# Patient Record
Sex: Female | Born: 1935 | Race: Black or African American | Hispanic: No | Marital: Single | Smoking: Never smoker
Health system: Southern US, Community
[De-identification: ages and names within clinical notes are randomized; demographics above are authoritative.]

## PROBLEM LIST (undated history)

## (undated) DIAGNOSIS — H269 Unspecified cataract: Secondary | ICD-10-CM

## (undated) DIAGNOSIS — Z8679 Personal history of other diseases of the circulatory system: Secondary | ICD-10-CM

## (undated) DIAGNOSIS — N289 Disorder of kidney and ureter, unspecified: Secondary | ICD-10-CM

## (undated) DIAGNOSIS — G8929 Other chronic pain: Secondary | ICD-10-CM

## (undated) DIAGNOSIS — I10 Essential (primary) hypertension: Secondary | ICD-10-CM

## (undated) DIAGNOSIS — G3184 Mild cognitive impairment, so stated: Secondary | ICD-10-CM

## (undated) DIAGNOSIS — M25569 Pain in unspecified knee: Secondary | ICD-10-CM

## (undated) DIAGNOSIS — R7303 Prediabetes: Secondary | ICD-10-CM

## (undated) HISTORY — DX: Disorder of kidney and ureter, unspecified: N28.9

## (undated) HISTORY — DX: Personal history of other diseases of the circulatory system: Z86.79

## (undated) HISTORY — DX: Essential (primary) hypertension: I10

## (undated) HISTORY — DX: Mild cognitive impairment of uncertain or unknown etiology: G31.84

## (undated) HISTORY — DX: Unspecified cataract: H26.9

## (undated) HISTORY — PX: ABDOMINAL HYSTERECTOMY: SHX81

## (undated) HISTORY — DX: Prediabetes: R73.03

## (undated) HISTORY — PX: CATARACT EXTRACTION: SUR2

---

## 2014-04-30 DIAGNOSIS — L853 Xerosis cutis: Secondary | ICD-10-CM | POA: Diagnosis not present

## 2014-05-07 DIAGNOSIS — L2089 Other atopic dermatitis: Secondary | ICD-10-CM | POA: Diagnosis not present

## 2014-07-10 DIAGNOSIS — H40013 Open angle with borderline findings, low risk, bilateral: Secondary | ICD-10-CM | POA: Diagnosis not present

## 2014-10-15 DIAGNOSIS — S76811A Strain of other specified muscles, fascia and tendons at thigh level, right thigh, initial encounter: Secondary | ICD-10-CM | POA: Diagnosis not present

## 2014-10-15 DIAGNOSIS — M25551 Pain in right hip: Secondary | ICD-10-CM | POA: Diagnosis not present

## 2015-01-05 DIAGNOSIS — H538 Other visual disturbances: Secondary | ICD-10-CM | POA: Diagnosis not present

## 2015-01-05 DIAGNOSIS — J42 Unspecified chronic bronchitis: Secondary | ICD-10-CM | POA: Diagnosis not present

## 2015-01-05 DIAGNOSIS — H269 Unspecified cataract: Secondary | ICD-10-CM | POA: Diagnosis not present

## 2015-01-10 DIAGNOSIS — H53433 Sector or arcuate defects, bilateral: Secondary | ICD-10-CM | POA: Diagnosis not present

## 2015-01-10 DIAGNOSIS — H2512 Age-related nuclear cataract, left eye: Secondary | ICD-10-CM | POA: Diagnosis not present

## 2015-01-10 DIAGNOSIS — H16223 Keratoconjunctivitis sicca, not specified as Sjogren's, bilateral: Secondary | ICD-10-CM | POA: Diagnosis not present

## 2015-01-10 DIAGNOSIS — H40013 Open angle with borderline findings, low risk, bilateral: Secondary | ICD-10-CM | POA: Diagnosis not present

## 2015-02-04 DIAGNOSIS — H2512 Age-related nuclear cataract, left eye: Secondary | ICD-10-CM | POA: Diagnosis not present

## 2015-02-04 DIAGNOSIS — H269 Unspecified cataract: Secondary | ICD-10-CM | POA: Diagnosis not present

## 2015-05-05 DIAGNOSIS — H40013 Open angle with borderline findings, low risk, bilateral: Secondary | ICD-10-CM | POA: Diagnosis not present

## 2015-09-10 DIAGNOSIS — H40013 Open angle with borderline findings, low risk, bilateral: Secondary | ICD-10-CM | POA: Diagnosis not present

## 2015-11-24 ENCOUNTER — Emergency Department (HOSPITAL_COMMUNITY): Payer: Medicare Other

## 2015-11-24 ENCOUNTER — Emergency Department (HOSPITAL_COMMUNITY)
Admission: EM | Admit: 2015-11-24 | Discharge: 2015-11-24 | Disposition: A | Discharge status: Home / Self Care | Payer: Medicare Other | Attending: Emergency Medicine | Admitting: Emergency Medicine

## 2015-11-24 ENCOUNTER — Encounter (HOSPITAL_COMMUNITY): Payer: Self-pay | Admitting: Emergency Medicine

## 2015-11-24 DIAGNOSIS — K5900 Constipation, unspecified: Secondary | ICD-10-CM | POA: Diagnosis not present

## 2015-11-24 DIAGNOSIS — M79604 Pain in right leg: Secondary | ICD-10-CM | POA: Insufficient documentation

## 2015-11-24 DIAGNOSIS — M79661 Pain in right lower leg: Secondary | ICD-10-CM | POA: Diagnosis not present

## 2015-11-24 LAB — COMPREHENSIVE METABOLIC PANEL
ALBUMIN: 3.9 g/dL (ref 3.5–5.0)
ALK PHOS: 67 U/L (ref 38–126)
ALT: 17 U/L (ref 14–54)
AST: 18 U/L (ref 15–41)
Anion gap: 6 (ref 5–15)
BILIRUBIN TOTAL: 0.3 mg/dL (ref 0.3–1.2)
BUN: 17 mg/dL (ref 6–20)
CALCIUM: 9.1 mg/dL (ref 8.9–10.3)
CO2: 27 mmol/L (ref 22–32)
Chloride: 106 mmol/L (ref 101–111)
Creatinine, Ser: 1.09 mg/dL — ABNORMAL HIGH (ref 0.44–1.00)
GFR calc Af Amer: 51 mL/min — ABNORMAL LOW (ref >60)
GFR calc non Af Amer: 44 mL/min — ABNORMAL LOW (ref >60)
GLUCOSE: 84 mg/dL (ref 65–99)
Potassium: 4.1 mmol/L (ref 3.5–5.1)
SODIUM: 139 mmol/L (ref 135–145)
TOTAL PROTEIN: 7 g/dL (ref 6.5–8.1)

## 2015-11-24 LAB — CBC WITH DIFFERENTIAL/PLATELET
BASOS ABS: 0 10*3/uL (ref 0.0–0.1)
BASOS PCT: 0 %
EOS ABS: 0.1 10*3/uL (ref 0.0–0.7)
Eosinophils Relative: 1 %
HEMATOCRIT: 43.7 % (ref 36.0–46.0)
HEMOGLOBIN: 14.1 g/dL (ref 12.0–15.0)
Lymphocytes Relative: 34 %
Lymphs Abs: 2.2 10*3/uL (ref 0.7–4.0)
MCH: 29.3 pg (ref 26.0–34.0)
MCHC: 32.3 g/dL (ref 30.0–36.0)
MCV: 90.9 fL (ref 78.0–100.0)
MONOS PCT: 11 %
Monocytes Absolute: 0.7 10*3/uL (ref 0.1–1.0)
NEUTROS ABS: 3.4 10*3/uL (ref 1.7–7.7)
NEUTROS PCT: 54 %
Platelets: 210 10*3/uL (ref 150–400)
RBC: 4.81 MIL/uL (ref 3.87–5.11)
RDW: 12.9 % (ref 11.5–15.5)
WBC: 6.3 10*3/uL (ref 4.0–10.5)

## 2015-11-24 NOTE — Discharge Instructions (Signed)
Your ultrasound was normal !  You can follow up with your doctor as needed Motrin / tylenol for pain

## 2015-11-24 NOTE — ED Notes (Signed)
MD at bedside to review discharge.Patient given discharge instruction, verbalized understand. Patient ambulatory out of the department.

## 2015-11-24 NOTE — ED Provider Notes (Signed)
[facility] Provider Note   CSN: [id number] Arrival date & time: 11/24/15  1129  First Provider Contact:  First MD Initiated Contact with Patient 11/24/15 1324     By signing my name below, I, Emmanuella Mensah, attest that this documentation has been prepared under the direction and in the presence of Eber Hong, MD. Electronically Signed: Angelene Giovanni, ED Scribe. 11/24/15. 1:37 PM.    History   Chief Complaint Chief Complaint  Patient presents with  . Leg Pain    HPI Comments: Lisa Harper is a 82 y.o. female who presents to the Emergency Department complaining of ongoing moderate right leg pain (mostly knee) onset 2-3 months ago. She states that she notices that her right leg is more achy and stiff when there is overcast, especially in the am when she wakes up. She also c/o straining while having small round dark stool onset several months ago. No alleviating factors noted. She reports that she has been exercising with mild relief. She denies any recent falls, injuries, or trauma. She denies a hx of cancer. Pt denies that she is current smoker. She also denies any leg swelling, chest pain, SOB.  She denies any risk factors for a DVT.   The history is provided by the patient. No language interpreter was used.    History reviewed. No pertinent past medical history.  There are no active problems to display for this patient.   Past Surgical History:  Procedure Laterality Date  . CATARACT EXTRACTION      OB History    Gravida Para Term Preterm AB Living             0   SAB TAB Ectopic Multiple Live Births                   Home Medications    Prior to Admission medications   Not on File    Family History History reviewed. No pertinent family history.  Social History Social History  Substance Use Topics  . Smoking status: Never Smoker  . Smokeless tobacco: Never Used  . Alcohol use No     Allergies   Review of patient's allergies indicates no  known allergies.   Review of Systems Review of Systems  Respiratory: Negative for shortness of breath.   Cardiovascular: Negative for chest pain and leg swelling.  Gastrointestinal: Positive for constipation.  Musculoskeletal: Positive for arthralgias.  All other systems reviewed and are negative.    Physical Exam Updated Vital Signs BP 178/57 (BP Location: Left Arm)   Pulse (!) 48   Temp 98.1 F (36.7 C) (Oral)   Resp 17   Ht 5\' 4"  (1.626 m)   Wt 128 lb (58.1 kg)   SpO2 100%   BMI 21.97 kg/m   Physical Exam  Constitutional: She appears well-developed and well-nourished. No distress.  HENT:  Head: Normocephalic and atraumatic.  Mouth/Throat: Oropharynx is clear and moist. No oropharyngeal exudate.  Eyes: Conjunctivae and EOM are normal. Pupils are equal, round, and reactive to light. Right eye exhibits no discharge. Left eye exhibits no discharge. No scleral icterus.  Neck: Normal range of motion. Neck supple. No JVD present. No thyromegaly present.  Cardiovascular: Normal rate, regular rhythm, normal heart sounds and intact distal pulses.  Exam reveals no gallop and no friction rub.   No murmur heard. Pulmonary/Chest: Effort normal and breath sounds normal. No respiratory distress. She has no wheezes. She has no rales.  Abdominal: Soft. Bowel sounds are normal.  She exhibits no distension and no mass. There is no tenderness.  Musculoskeletal: Normal range of motion. She exhibits no edema or tenderness.  Supple joints and soft compartments, no edema or swelling or asymetry  Lymphadenopathy:    She has no cervical adenopathy.  Neurological: She is alert. Coordination normal.  Skin: Skin is warm and dry. No rash noted. No erythema.  Psychiatric: She has a normal mood and affect. Her behavior is normal.  Nursing note and vitals reviewed.    ED Treatments / Results  DIAGNOSTIC STUDIES: Oxygen Saturation is 99% on RA, normal by my interpretation.    COORDINATION OF  CARE: 1:35 PM- Pt advised of plan for treatment and pt agrees. Pt will receive lab work for further evaluation.    Labs (all labs ordered are listed, but only abnormal results are displayed) Labs Reviewed  COMPREHENSIVE METABOLIC PANEL - Abnormal; Notable for the following:       Result Value   Creatinine, Ser 1.09 (*)    GFR calc non Af Amer 44 (*)    GFR calc Af Amer 51 (*)    All other components within normal limits  CBC WITH DIFFERENTIAL/PLATELET    EKG  EKG Interpretation None       Radiology US Venous Img Lower Unilateral Right  Result Date: 11/24/2015 CLINICAL DATA:  Right lower extremity pain EXAM: RIGHT LOWER EXTREMITY VENOUS DOPPLER ULTRASOUND TECHNIQUE: Gray-scale sonography with graded compression, as well as color Doppler and duplex ultrasound were performed to evaluate the lower extremity deep venous systems from the level of the common femoral vein and including the common femoral, femoral, profunda femoral, popliteal and calf veins including the posterior tibial, peroneal and gastrocnemius veins when visible. The superficial great saphenous vein was also interrogated. Spectral Doppler was utilized to evaluate flow at rest and with distal augmentation maneuvers in the common femoral, femoral and popliteal veins. COMPARISON:  None. FINDINGS: Contralateral Common Femoral Vein: Respiratory phasicity is normal and symmetric with the symptomatic side. No evidence of thrombus. Normal compressibility. Common Femoral Vein: No evidence of thrombus. Normal compressibility, respiratory phasicity and response to augmentation. Saphenofemoral Junction: No evidence of thrombus. Normal compressibility and flow on color Doppler imaging. Profunda Femoral Vein: No evidence of thrombus. Normal compressibility and flow on color Doppler imaging. Femoral Vein: No evidence of thrombus. Normal compressibility, respiratory phasicity and response to augmentation. Popliteal Vein: No evidence of  thrombus. Normal compressibility, respiratory phasicity and response to augmentation. Calf Veins: No evidence of thrombus. Normal compressibility and flow on color Doppler imaging. Superficial Great Saphenous Vein: No evidence of thrombus. Normal compressibility and flow on color Doppler imaging. Venous Reflux:  None. Other Findings:  None. IMPRESSION: No evidence of deep venous thrombosis. Electronically Signed   By: Gerome Sam III M.D   On: 11/24/2015 15:25    Procedures Procedures (including critical care time)  Medications Ordered in ED Medications - No data to display   Initial Impression / Assessment and Plan / ED Course  Eber Hong, MD has reviewed the triage vital signs and the nursing notes.  Pertinent labs & imaging results that were available during my care of the patient were reviewed by me and considered in my medical decision making (see chart for details).  Clinical Course    [place] neg, labs neg, pt stable for d/c.  Final Clinical Impressions(s) / ED Diagnoses   Final diagnoses:  Right leg pain   I personally performed the services described in this documentation, which was scribed  in my presence. The recorded information has been reviewed and is accurate.       New Prescriptions New Prescriptions   No medications on file     Eber Hong, MD 11/24/15 1656

## 2015-11-24 NOTE — ED Triage Notes (Signed)
PT c/o bilateral leg pain at times and states "my legs hurt when there is a heavy overcast." PT also states generalized weakness x1 month. PT denies any falls or injury.

## 2016-01-06 DIAGNOSIS — I10 Essential (primary) hypertension: Secondary | ICD-10-CM | POA: Diagnosis not present

## 2016-01-06 DIAGNOSIS — M21619 Bunion of unspecified foot: Secondary | ICD-10-CM | POA: Diagnosis not present

## 2016-01-06 DIAGNOSIS — R011 Cardiac murmur, unspecified: Secondary | ICD-10-CM | POA: Diagnosis not present

## 2016-01-06 DIAGNOSIS — M2040 Other hammer toe(s) (acquired), unspecified foot: Secondary | ICD-10-CM | POA: Diagnosis not present

## 2016-01-06 DIAGNOSIS — M25569 Pain in unspecified knee: Secondary | ICD-10-CM | POA: Diagnosis not present

## 2016-01-06 DIAGNOSIS — G3184 Mild cognitive impairment, so stated: Secondary | ICD-10-CM | POA: Diagnosis not present

## 2016-01-27 DIAGNOSIS — Z961 Presence of intraocular lens: Secondary | ICD-10-CM | POA: Diagnosis not present

## 2016-02-09 DIAGNOSIS — I10 Essential (primary) hypertension: Secondary | ICD-10-CM | POA: Diagnosis not present

## 2016-02-09 DIAGNOSIS — Z0000 Encounter for general adult medical examination without abnormal findings: Secondary | ICD-10-CM | POA: Diagnosis not present

## 2016-02-20 ENCOUNTER — Telehealth: Payer: Self-pay

## 2016-02-20 NOTE — Telephone Encounter (Signed)
Called pt to set up new pt appt with dr [name], pt states she will call back when she is able to come.

## 2016-02-24 ENCOUNTER — Ambulatory Visit (INDEPENDENT_AMBULATORY_CARE_PROVIDER_SITE_OTHER): Payer: Medicare Other | Admitting: Family Medicine

## 2016-02-24 ENCOUNTER — Encounter: Payer: Self-pay | Admitting: Family Medicine

## 2016-02-24 VITALS — BP 104/60 | HR 56 | Temp 97.7°F | Resp 16 | Ht 65.0 in | Wt 122.0 lb

## 2016-02-24 DIAGNOSIS — Z2821 Immunization not carried out because of patient refusal: Secondary | ICD-10-CM | POA: Diagnosis not present

## 2016-02-24 DIAGNOSIS — Z961 Presence of intraocular lens: Secondary | ICD-10-CM | POA: Insufficient documentation

## 2016-02-24 DIAGNOSIS — N289 Disorder of kidney and ureter, unspecified: Secondary | ICD-10-CM

## 2016-02-24 DIAGNOSIS — Z7689 Persons encountering health services in other specified circumstances: Secondary | ICD-10-CM

## 2016-02-24 DIAGNOSIS — J302 Other seasonal allergic rhinitis: Secondary | ICD-10-CM | POA: Insufficient documentation

## 2016-02-24 HISTORY — DX: Disorder of kidney and ureter, unspecified: N28.9

## 2016-02-24 NOTE — Progress Notes (Signed)
Chief Complaint  Patient presents with  . Establish Care   New to establish Poor historian with evasive answers at times.  She says she has no medical problems but the record indicates hypertension with a past use of cozaar. Single, lives alone, no children.  Still drives and cares for herself. Here with a cousin She only complains of watery eyes and runny nose, this fall. Says she eats well and cares for herself, takes no medicine She does not want immunizations   Patient Active Problem List   Diagnosis Date Noted  . Immunization refused 02/24/2016  . Renal insufficiency 02/24/2016  . Pseudophakia of both eyes 02/24/2016  . Seasonal allergies 02/24/2016    Outpatient Encounter Prescriptions as of 02/24/2016  Medication Sig  . Sennosides (EX-LAX) 15 MG CHEW Chew by mouth.   No facility-administered encounter medications on file as of 02/24/2016.     Past Medical History:  Diagnosis Date  . Cataract   . Renal insufficiency 02/24/2016    Past Surgical History:  Procedure Laterality Date  . ABDOMINAL HYSTERECTOMY     for pain  . CATARACT EXTRACTION      Social History   Social History  . Marital status: Single    Spouse name: N/A  . Number of children: N/A  . Years of education: N/A   Occupational History  . Not on file.   Social History Main Topics  . Smoking status: Never Smoker  . Smokeless tobacco: Never Used  . Alcohol use No  . Drug use: No  . Sexual activity: Not Currently   Other Topics Concern  . Not on file   Social History Narrative   Single,  Lives alone.  Never married, no children    Family History  Problem Relation Age of Onset  . Suicidality Father     Review of Systems  Constitutional: Negative for chills, fever and weight loss.  HENT: Negative for congestion and hearing loss.        Clear runny nose  Eyes: Negative for blurred vision and pain.       "watery eyes"  Respiratory: Negative for cough and shortness of breath.     Cardiovascular: Negative for chest pain and leg swelling.  Gastrointestinal: Negative for abdominal pain, constipation, diarrhea and heartburn.  Genitourinary: Negative for dysuria and frequency.  Musculoskeletal: Negative for falls, joint pain and myalgias.  Neurological: Negative for dizziness, seizures and headaches.  Psychiatric/Behavioral: Negative for depression. The patient is not nervous/anxious and does not have insomnia.     BP 104/60 (BP Location: Right Arm, Patient Position: Sitting, Cuff Size: Normal)   Pulse (!) 56   Temp 97.7 F (36.5 C) (Oral)   Resp 16   Ht 5\' 5"  (1.651 m)   Wt 122 lb 0.6 oz (55.4 kg)   SpO2 99%   BMI 20.31 kg/m   Physical Exam  Constitutional: She is oriented to person, place, and time. She appears well-developed and well-nourished.  HENT:  Head: Normocephalic and atraumatic.  Right Ear: External ear normal.  Left Ear: External ear normal.  Mouth/Throat: Oropharynx is clear and moist.  Eyes: Conjunctivae are normal. Pupils are equal, round, and reactive to light.  Neck: Normal range of motion. Neck supple. No thyromegaly present.  Cardiovascular: Normal rate, regular rhythm and normal heart sounds.   Pulmonary/Chest: Effort normal and breath sounds normal. No respiratory distress.  Abdominal: Soft. Bowel sounds are normal.  Musculoskeletal: Normal range of motion. She exhibits no edema.  Lymphadenopathy:  She has no cervical adenopathy.  Neurological: She is alert and oriented to person, place, and time.  Gait normal  Skin: Skin is warm and dry.  Psychiatric: She has a normal mood and affect. Her behavior is normal. Thought content normal.  Articulate and smiling, evasive answers at times  Nursing note and vitals reviewed. ASSESSMENT/PLAN:   1. Encounter to establish care with new doctor   2. Immunization refused   3. Renal insufficiency From record  4. Pseudophakia of both eyes   5. Chronic seasonal allergic rhinitis,  unspecified trigger    Patient Instructions  We need to get records from Lisa Harper  See me once a year  Come sooner for any illness or medical problem  For the "allergy" eye symptoms, you may get a the pharmacy an allergy pill like loratadine 10 mg a day, or an allergy eye drop like Opcon-A    Lisa MooreYvonne Sue Arielis Leonhart, MD

## 2016-02-24 NOTE — Patient Instructions (Signed)
We need to get records from Dr Isabella StallingMc Ginnis  See me once a year  Come sooner for any illness or medical problem  For the "allergy" eye symptoms, you may get a the pharmacy an allergy pill like loratadine 10 mg a day, or an allergy eye drop like Opcon-A

## 2016-05-05 DIAGNOSIS — H04123 Dry eye syndrome of bilateral lacrimal glands: Secondary | ICD-10-CM | POA: Diagnosis not present

## 2016-05-20 ENCOUNTER — Ambulatory Visit: Payer: Medicare Other | Admitting: Family Medicine

## 2016-05-25 ENCOUNTER — Encounter: Payer: Self-pay | Admitting: Family Medicine

## 2016-06-01 ENCOUNTER — Ambulatory Visit (INDEPENDENT_AMBULATORY_CARE_PROVIDER_SITE_OTHER): Payer: Medicare Other | Admitting: Family Medicine

## 2016-06-01 ENCOUNTER — Encounter: Payer: Self-pay | Admitting: Family Medicine

## 2016-06-01 VITALS — BP 120/62 | HR 60 | Temp 97.0°F | Resp 18 | Ht 65.0 in | Wt 121.0 lb

## 2016-06-01 DIAGNOSIS — N289 Disorder of kidney and ureter, unspecified: Secondary | ICD-10-CM | POA: Diagnosis not present

## 2016-06-01 DIAGNOSIS — Z022 Encounter for examination for admission to residential institution: Secondary | ICD-10-CM | POA: Diagnosis not present

## 2016-06-01 NOTE — Progress Notes (Signed)
Chief Complaint  Patient presents with  . Follow-up    FL2   Here for assisted living placement Patient lives independently and drives She prefers help in the home Is looking at options  No recent falls No loss of weight No recent illness No prescription medicines    Patient Active Problem List   Diagnosis Date Noted  . Immunization refused 02/24/2016  . Renal insufficiency 02/24/2016  . Pseudophakia of both eyes 02/24/2016  . Seasonal allergies 02/24/2016    Outpatient Encounter Prescriptions as of 06/01/2016  Medication Sig  . Sennosides (EX-LAX) 15 MG CHEW Chew by mouth.   No facility-administered encounter medications on file as of 06/01/2016.     Past Medical History:  Diagnosis Date  . Cataract   . Essential hypertension    not currently on medicine  . History of cardiac murmur   . Mild cognitive impairment   . Pre-diabetes   . Renal insufficiency 02/24/2016    Past Surgical History:  Procedure Laterality Date  . ABDOMINAL HYSTERECTOMY     for pain  . CATARACT EXTRACTION      Social History   Social History  . Marital status: Single    Spouse name: N/A  . Number of children: N/A  . Years of education: N/A   Occupational History  . Not on file.   Social History Main Topics  . Smoking status: Never Smoker  . Smokeless tobacco: Never Used  . Alcohol use No  . Drug use: No  . Sexual activity: Not Currently   Other Topics Concern  . Not on file   Social History Narrative   Single,  Lives alone.  Never married, no children    Family History  Problem Relation Age of Onset  . Suicidality Father     Review of Systems  Constitutional: Negative for chills, fever and weight loss.  HENT: Negative for congestion and hearing loss.   Eyes: Negative for blurred vision and pain.  Respiratory: Negative for cough and shortness of breath.   Cardiovascular: Negative for chest pain and leg swelling.  Gastrointestinal: Positive for constipation.  Negative for abdominal pain, diarrhea and heartburn.       Occasional  Genitourinary: Negative for dysuria and frequency.  Musculoskeletal: Negative for falls, joint pain and myalgias.  Skin:       Dry skin.  Dry eyes  Neurological: Negative for dizziness, seizures and headaches.  Psychiatric/Behavioral: Negative for depression and memory loss. The patient is not nervous/anxious and does not have insomnia.    BP 120/62 (BP Location: Right Arm, Patient Position: Sitting, Cuff Size: Normal)   Pulse 60   Temp 97 F (36.1 C) (Temporal)   Resp 18   Ht 5\' 5"  (1.651 m)   Wt 121 lb (54.9 kg)   SpO2 100%   BMI 20.14 kg/m   Physical Exam  Constitutional: She is oriented to person, place, and time. She appears well-developed and well-nourished.  Good posture.  Looks well for 89  HENT:  Head: Normocephalic and atraumatic.  Right Ear: External ear normal.  Left Ear: External ear normal.  Mouth/Throat: Oropharynx is clear and moist.  Eyes: Conjunctivae are normal. Pupils are equal, round, and reactive to light.  Neck: Normal range of motion. Neck supple. No thyromegaly present.  Cardiovascular: Normal rate, regular rhythm and normal heart sounds.   Pulmonary/Chest: Effort normal and breath sounds normal. No respiratory distress.  Abdominal: Soft. Bowel sounds are normal.  No organomegaly or mass  Musculoskeletal:  Normal range of motion. She exhibits no edema.  Bunion R foot  Lymphadenopathy:    She has no cervical adenopathy.  Neurological: She is alert and oriented to person, place, and time.  Gait normal  Skin: Skin is warm and dry.  Psychiatric: She has a normal mood and affect. Thought content is paranoid. Cognition and memory are normal. She expresses impulsivity.  Evasive answers.  Normal mini cog with memory 2/3 recall and excellent clock drawing. Eccentric behavior.  Paranoid ideas at times ( calls police a lot)  Nursing note and vitals reviewed.  ASSESSMENT/PLAN:  1.  Encounter for examination for admission to assisted living facility See forms  2. Renal insufficiency History.  Reminded to drink water   Patient Instructions  I will submit your forms  Return as needed   Eustace Moore, MD

## 2016-06-01 NOTE — Patient Instructions (Signed)
I will submit your forms  Return as needed

## 2016-06-14 ENCOUNTER — Encounter: Payer: Self-pay | Admitting: Family Medicine

## 2016-06-14 ENCOUNTER — Ambulatory Visit (INDEPENDENT_AMBULATORY_CARE_PROVIDER_SITE_OTHER): Payer: Medicare Other | Admitting: Family Medicine

## 2016-06-14 VITALS — BP 116/68 | HR 64 | Temp 97.4°F | Resp 18 | Ht 65.0 in | Wt 121.1 lb

## 2016-06-14 DIAGNOSIS — K5909 Other constipation: Secondary | ICD-10-CM

## 2016-06-14 NOTE — Progress Notes (Signed)
Chief Complaint  Patient presents with  . Constipation    per pt   Patient is here for an acute visit. She states that she was awakened this morning with sharp lower abdominal pain. She had 2 or 3 waves of pain. When she got up she had a bowel movement. The pain then resolved. She states she has chronic constipation. She has hard stool. She has trouble moving her stool. She takes laxatives once or twice a week if she hasn't had a bowel movement for a couple of days. No blood in the bowels. No weight loss. No change in appetite.  Patient Active Problem List   Diagnosis Date Noted  . Immunization refused 02/24/2016  . Renal insufficiency 02/24/2016  . Pseudophakia of both eyes 02/24/2016  . Seasonal allergies 02/24/2016    Outpatient Encounter Prescriptions as of 06/14/2016  Medication Sig  . Sennosides (EX-LAX) 15 MG CHEW Chew by mouth.   No facility-administered encounter medications on file as of 06/14/2016.     No Known Allergies  Review of Systems  Constitutional: Negative for activity change, appetite change, fatigue and unexpected weight change.  HENT: Negative for congestion and postnasal drip.   Eyes: Negative for visual disturbance.  Respiratory: Negative for cough and shortness of breath.   Cardiovascular: Negative for chest pain and palpitations.  Gastrointestinal: Positive for constipation. Negative for anal bleeding, blood in stool, nausea and vomiting.  Genitourinary: Negative for difficulty urinating and frequency.  Musculoskeletal: Negative.   Psychiatric/Behavioral: Negative for sleep disturbance. The patient is not nervous/anxious.   All other systems reviewed and are negative.  BP 116/68 (BP Location: Left Arm, Patient Position: Sitting, Cuff Size: Normal)   Pulse 64   Temp 97.4 F (36.3 C) (Oral)   Resp 18   Ht 5\' 5"  (1.651 m)   Wt 121 lb 1.9 oz (54.9 kg)   SpO2 100%   BMI 20.16 kg/m   Physical Exam  Constitutional: She is oriented to person,  place, and time. She appears well-developed and well-nourished. No distress.  HENT:  Head: Normocephalic and atraumatic.  Mouth/Throat: Oropharynx is clear and moist.  Eyes: Conjunctivae are normal. Pupils are equal, round, and reactive to light.  Neck: Normal range of motion.  Cardiovascular: Normal rate, regular rhythm and normal heart sounds.   Pulmonary/Chest: Effort normal.  Abdominal: Soft. Bowel sounds are normal. She exhibits no distension. There is no tenderness.  Lymphadenopathy:    She has no cervical adenopathy.  Neurological: She is alert and oriented to person, place, and time.  Psychiatric: She has a normal mood and affect. Her behavior is normal.    ASSESSMENT/PLAN:  1. Chronic constipation    Patient Instructions  Drink more water Walk every day that you are able Eat more vegetables and fruits Add a fiber supplement like metamucil   Constipation, Adult Constipation is when a person has fewer bowel movements in a week than normal, has difficulty having a bowel movement, or has stools that are dry, hard, or larger than normal. Constipation may be caused by an underlying condition. It may become worse with age if a person takes certain medicines and does not take in enough fluids. Follow these instructions at home: Eating and drinking  Eat foods that have a lot of fiber, such as fresh fruits and vegetables, whole grains, and beans.  Limit foods that are high in fat, low in fiber, or overly processed, such as french fries, hamburgers, cookies, candies, and soda.  Drink enough  fluid to keep your urine clear or pale yellow. General instructions  Exercise regularly or as told by your health care provider.  Go to the restroom when you have the urge to go. Do not hold it in.  Take over-the-counter and prescription medicines only as told by your health care provider. These include any fiber supplements.  Practice pelvic floor retraining exercises, such as deep  breathing while relaxing the lower abdomen and pelvic floor relaxation during bowel movements.  Watch your condition for any changes.  Keep all follow-up visits as told by your health care provider. This is important. Contact a health care provider if:  You have pain that gets worse.  You have a fever.  You do not have a bowel movement after 4 days.  You vomit.  You are not hungry.  You lose weight.  You are bleeding from the anus.  You have thin, pencil-like stools. Get help right away if:  You have a fever and your symptoms suddenly get worse.  You leak stool or have blood in your stool.  Your abdomen is bloated.  You have severe pain in your abdomen.  You feel dizzy or you faint. This information is not intended to replace advice given to you by your health care provider. Make sure you discuss any questions you have with your health care provider. Document Released: 01/09/2004 Document Revised: 10/31/2015 Document Reviewed: 10/01/2015 Elsevier Interactive Patient Education  2017 [employer]    Eustace MooreYvonne Sue Weylyn Ricciuti, MD

## 2016-06-14 NOTE — Patient Instructions (Addendum)
Drink more water Walk every day that you are able Eat more vegetables and fruits Add a fiber supplement like metamucil   Constipation, Adult Constipation is when a person has fewer bowel movements in a week than normal, has difficulty having a bowel movement, or has stools that are dry, hard, or larger than normal. Constipation may be caused by an underlying condition. It may become worse with age if a person takes certain medicines and does not take in enough fluids. Follow these instructions at home: Eating and drinking  Eat foods that have a lot of fiber, such as fresh fruits and vegetables, whole grains, and beans.  Limit foods that are high in fat, low in fiber, or overly processed, such as french fries, hamburgers, cookies, candies, and soda.  Drink enough fluid to keep your urine clear or pale yellow. General instructions  Exercise regularly or as told by your health care provider.  Go to the restroom when you have the urge to go. Do not hold it in.  Take over-the-counter and prescription medicines only as told by your health care provider. These include any fiber supplements.  Practice pelvic floor retraining exercises, such as deep breathing while relaxing the lower abdomen and pelvic floor relaxation during bowel movements.  Watch your condition for any changes.  Keep all follow-up visits as told by your health care provider. This is important. Contact a health care provider if:  You have pain that gets worse.  You have a fever.  You do not have a bowel movement after 4 days.  You vomit.  You are not hungry.  You lose weight.  You are bleeding from the anus.  You have thin, pencil-like stools. Get help right away if:  You have a fever and your symptoms suddenly get worse.  You leak stool or have blood in your stool.  Your abdomen is bloated.  You have severe pain in your abdomen.  You feel dizzy or you faint. This information is not intended to  replace advice given to you by your health care provider. Make sure you discuss any questions you have with your health care provider. Document Released: 01/09/2004 Document Revised: 10/31/2015 Document Reviewed: 10/01/2015 Elsevier Interactive Patient Education  2017 [employer]

## 2016-06-22 ENCOUNTER — Ambulatory Visit: Payer: Medicare Other | Admitting: Family Medicine

## 2016-07-03 ENCOUNTER — Emergency Department (HOSPITAL_COMMUNITY)
Admission: EM | Admit: 2016-07-03 | Discharge: 2016-07-03 | Disposition: A | Discharge status: Home / Self Care | Payer: Medicare Other | Attending: Emergency Medicine | Admitting: Emergency Medicine

## 2016-07-03 ENCOUNTER — Encounter (HOSPITAL_COMMUNITY): Payer: Self-pay | Admitting: Emergency Medicine

## 2016-07-03 DIAGNOSIS — R440 Auditory hallucinations: Secondary | ICD-10-CM | POA: Diagnosis not present

## 2016-07-03 DIAGNOSIS — R410 Disorientation, unspecified: Secondary | ICD-10-CM | POA: Diagnosis not present

## 2016-07-03 DIAGNOSIS — R69 Illness, unspecified: Secondary | ICD-10-CM | POA: Diagnosis not present

## 2016-07-03 DIAGNOSIS — I10 Essential (primary) hypertension: Secondary | ICD-10-CM | POA: Diagnosis not present

## 2016-07-03 DIAGNOSIS — I6789 Other cerebrovascular disease: Secondary | ICD-10-CM | POA: Diagnosis not present

## 2016-07-03 NOTE — ED Triage Notes (Signed)
Pt states that she called police because her cousin's husband came to home today and used his keys to enter home.  Pt thinks man made changes to her locks in her home so she called the police.  Police responded to well check and called EMS.  Police told EMS pt had been hallucinating however pt states she has not seen or heard anything that was not actually there

## 2016-07-03 NOTE — ED Provider Notes (Signed)
[facility]Y DEPT Provider Note   CSN: [id number] Arrival date & time: 07/03/16  1923     History   Chief Complaint Chief Complaint  Patient presents with  . well check    HPI Lisa Harper is a 83 y.o. female.  HPI Patient is brought to the emergency department by EMS after police were called because she believed demand may be changing the locks on her house.  Patient lives at home by herself.  She is alert and oriented 3.  She knows it is 07/03/2016.  She knows that the president is Garnet Koyanagi.  She states that a family member came over to the house and when he left she had difficulty getting out of a room and she ought that maybe the locks and then change.  No recent fevers or chills.  Patient without chest pain or abdominal pain or headache at this time.   Past Medical History:  Diagnosis Date  . Cataract   . Essential hypertension    not currently on medicine  . History of cardiac murmur   . Mild cognitive impairment   . Pre-diabetes   . Renal insufficiency 02/24/2016    Patient Active Problem List   Diagnosis Date Noted  . Immunization refused 02/24/2016  . Renal insufficiency 02/24/2016  . Pseudophakia of both eyes 02/24/2016  . Seasonal allergies 02/24/2016    Past Surgical History:  Procedure Laterality Date  . ABDOMINAL HYSTERECTOMY     for pain  . CATARACT EXTRACTION      OB History    Gravida Para Term Preterm AB Living             0   SAB TAB Ectopic Multiple Live Births                   Home Medications    Prior to Admission medications   Medication Sig Start Date End Date Taking? Authorizing Provider  Sennosides (EX-LAX) 15 MG CHEW Chew by mouth.   Yes Historical Provider, MD    Family History Family History  Problem Relation Age of Onset  . Suicidality Father     Social History Social History  Substance Use Topics  . Smoking status: Never Smoker  . Smokeless tobacco: Never Used  . Alcohol use No     Allergies     Patient has no known allergies.   Review of Systems Review of Systems  All other systems reviewed and are negative.    Physical Exam Updated Vital Signs BP (!) 140/49 (BP Location: Left Arm)   Pulse 68   Temp 98.6 F (37 C) (Oral)   Resp 18   Ht 5\' 5"  (1.651 m)   Wt 116 lb (52.6 kg)   SpO2 99%   BMI 19.30 kg/m   Physical Exam  Constitutional: She is oriented to person, place, and time. She appears well-developed and well-nourished. No distress.  HENT:  Head: Normocephalic and atraumatic.  Eyes: EOM are normal. Pupils are equal, round, and reactive to light.  Neck: Normal range of motion.  Cardiovascular: Normal rate, regular rhythm and normal heart sounds.   Pulmonary/Chest: Effort normal and breath sounds normal.  Abdominal: Soft. She exhibits no distension. There is no tenderness.  Musculoskeletal: Normal range of motion.  Neurological: She is alert and oriented to person, place, and time.  5/5 strength in major muscle groups of  bilateral upper and lower extremities. Speech normal. No facial asymetry.   Skin: Skin is warm and dry.  Psychiatric: She has a normal mood and affect. Judgment normal.  Nursing note and vitals reviewed.    ED Treatments / Results  Labs (all labs ordered are listed, but only abnormal results are displayed) Labs Reviewed - No data to display  EKG  EKG Interpretation None       Radiology No results found.  Procedures Procedures (including critical care time)  Medications Ordered in ED Medications - No data to display   Initial Impression / Assessment and Plan / ED Course  I have reviewed the triage vital signs and the nursing notes.  Pertinent labs & imaging results that were available during my care of the patient were reviewed by me and considered in my medical decision making (see chart for details).     Patient is well-dressed.  She's a normal closed.  Her hair is then combed.  She is not disheveled.  She is alert  and oriented 3.  She has no focus complaints.  I do not think she is a large workup.  Emergency department.  I do not think she needs to be admitted to a psychiatric hospital.  I do not think she is unsafe.  She'll be discharged from the emergency department.  Final Clinical Impressions(s) / ED Diagnoses   Final diagnoses:  Transient confusion    New Prescriptions New Prescriptions   No medications on file     Azalia BilisKevin Dayshon Roback, MD 07/03/16 2143

## 2016-07-03 NOTE — ED Notes (Signed)
Pt to be transported home by [employer].

## 2016-07-03 NOTE — ED Notes (Signed)
Pt states understanding of care given and follow up instructions.  Discussed pt finding a PCP and joining some church groups to help keep an eye on pt.  Unable to contact family members for transport.

## 2016-07-07 ENCOUNTER — Telehealth (INDEPENDENT_AMBULATORY_CARE_PROVIDER_SITE_OTHER): Payer: Medicare Other

## 2016-07-07 DIAGNOSIS — Z111 Encounter for screening for respiratory tuberculosis: Secondary | ICD-10-CM | POA: Diagnosis not present

## 2016-07-07 NOTE — Telephone Encounter (Signed)
Pt in office for ppd placement

## 2016-07-09 LAB — TB SKIN TEST
Induration: 0 mm
TB Skin Test: NEGATIVE

## 2016-07-13 ENCOUNTER — Encounter: Payer: Self-pay | Admitting: Family Medicine

## 2016-07-13 ENCOUNTER — Ambulatory Visit (INDEPENDENT_AMBULATORY_CARE_PROVIDER_SITE_OTHER): Payer: Medicare Other | Admitting: Family Medicine

## 2016-07-13 VITALS — BP 130/72 | HR 76 | Temp 97.8°F | Resp 16 | Ht 65.0 in | Wt 118.0 lb

## 2016-07-13 DIAGNOSIS — Z024 Encounter for examination for driving license: Secondary | ICD-10-CM | POA: Diagnosis not present

## 2016-07-13 DIAGNOSIS — J302 Other seasonal allergic rhinitis: Secondary | ICD-10-CM | POA: Diagnosis not present

## 2016-07-13 NOTE — Progress Notes (Signed)
Chief Complaint  Patient presents with  . Follow-up   Lisa Harper is here at her request She states she saw her attorney this morning and he wanted her to have a medical clearance to drive a vehicle. She insists there has been no trouble with her driving, no accidents or police reports.  She says her license expired and she needs help to get another because of her advanced age.I did a mini cognitive evaluation on her recently and she did pretty well, only forgot one item indicating early short term memory impairment. She has no known medical conditions that would affect her ability to drive safely. Lisa Harper may have some mental illness - it has been very difficult for me to pinpoint.  She exhibits evasive and paranoid ideations and it seems she calls the police a lot for conflict - like thinking someone changed the locks on her home.  Patient Active Problem List   Diagnosis Date Noted  . Immunization refused 02/24/2016  . Renal insufficiency 02/24/2016  . Pseudophakia of both eyes 02/24/2016  . Seasonal allergies 02/24/2016    Outpatient Encounter Prescriptions as of 07/13/2016  Medication Sig  . Sennosides (EX-LAX) 15 MG CHEW Chew by mouth.   No facility-administered encounter medications on file as of 07/13/2016.     No Known Allergies  Review of Systems  Constitutional: Negative for activity change and unexpected weight change.  HENT: Negative for dental problem and hearing loss.   Eyes: Negative for redness and visual disturbance.  Respiratory: Negative for cough and shortness of breath.   Cardiovascular: Negative for chest pain, palpitations and leg swelling.  Gastrointestinal: Positive for constipation. Negative for diarrhea.       Occasional  Genitourinary: Negative for difficulty urinating and urgency.  Musculoskeletal: Negative.  Negative for gait problem.       No falls  Neurological: Negative for dizziness and light-headedness.  Psychiatric/Behavioral: Positive for  confusion. Negative for hallucinations and sleep disturbance. The patient is not nervous/anxious.        Possible    BP 130/72 (BP Location: Right Arm, Patient Position: Sitting, Cuff Size: Normal)   Pulse 76   Temp 97.8 F (36.6 C) (Temporal)   Resp 16   Ht 5\' 5"  (1.651 m)   Wt 118 lb (53.5 kg)   SpO2 98%   BMI 19.64 kg/m   Physical Exam  Constitutional: She is oriented to person, place, and time. She appears well-developed and well-nourished.  Good posture.  Looks well for 89  HENT:  Head: Normocephalic and atraumatic.  Right Ear: External ear normal.  Left Ear: External ear normal.  Mouth/Throat: Oropharynx is clear and moist.  Eyes: Conjunctivae are normal. Pupils are equal, round, and reactive to light.  Visual acuity 20/25 both eyes on chart  Neck: Normal range of motion. Neck supple. No thyromegaly present.  Cardiovascular: Normal rate, regular rhythm and normal heart sounds.   Pulmonary/Chest: Effort normal and breath sounds normal. No respiratory distress.  Abdominal: Soft. Bowel sounds are normal.  No organomegaly or mass  Musculoskeletal: Normal range of motion. She exhibits no edema.  Bunion R foot  Lymphadenopathy:    She has no cervical adenopathy.  Neurological: She is alert and oriented to person, place, and time.  Gait normal  Skin: Skin is warm and dry.  Psychiatric: She has a normal mood and affect. Thought content is paranoid. Cognition and memory are normal. She expresses impulsivity.  Evasive answers. . Eccentric behavior.  Paranoid ideas at times (  calls police a lot)  Nursing note and vitals reviewed.   ASSESSMENT/PLAN:  1. Chronic seasonal allergic rhinitis, unspecified trigger 2. evaluation to drive vehicle  She has normal vision and hearing.  Good use of arms and legs. Minimal memory impairment on examination.  Question paranoid behavior.  No reported accidents or incidents.  Patient Instructions  You may drive You should drive only during  day You should drive only familiar places  Let me know if you need other medical care Greater than 50% of this visit was spent in counseling and coordinating care.  Total face to face time:  25 min   Eustace MooreYvonne Harper Lisa Trevor, MD

## 2016-07-13 NOTE — Patient Instructions (Signed)
You may drive You should drive only during day You should drive only familiar places  Let me know if you need other medical care

## 2016-08-17 ENCOUNTER — Encounter: Payer: Self-pay | Admitting: Family Medicine

## 2016-08-17 ENCOUNTER — Ambulatory Visit (INDEPENDENT_AMBULATORY_CARE_PROVIDER_SITE_OTHER): Payer: Medicare Other | Admitting: Family Medicine

## 2016-08-17 VITALS — BP 118/62 | HR 76 | Temp 97.3°F | Resp 18 | Ht 65.0 in | Wt 121.0 lb

## 2016-08-17 DIAGNOSIS — K5909 Other constipation: Secondary | ICD-10-CM

## 2016-08-17 DIAGNOSIS — Z111 Encounter for screening for respiratory tuberculosis: Secondary | ICD-10-CM | POA: Diagnosis not present

## 2016-08-17 NOTE — Patient Instructions (Signed)
See me in six months Call sooner for problems 

## 2016-08-17 NOTE — Progress Notes (Signed)
    Chief Complaint  Patient presents with  . Follow-up   Patient is here for routine follow-up. She has no complaints. She is living in an assisted living facility. She is enjoying this. She likes the food. She likes the company.  She previously had constipation. She is given senna when necessary. She states her constipation is improved. She has gained 3 pounds since her last visit. No falls or injuries. She needs her second PPD today.  Patient Active Problem List   Diagnosis Date Noted  . Chronic constipation 08/17/2016  . Immunization refused 02/24/2016  . Renal insufficiency 02/24/2016  . Pseudophakia of both eyes 02/24/2016  . Seasonal allergies 02/24/2016    Outpatient Encounter Prescriptions as of 08/17/2016  Medication Sig  . Sennosides (EX-LAX) 15 MG CHEW Chew by mouth.   No facility-administered encounter medications on file as of 08/17/2016.     No Known Allergies  Review of Systems  Constitutional: Positive for unexpected weight change. Negative for activity change, appetite change and fatigue.  HENT: Negative for congestion and dental problem.   Eyes: Negative for visual disturbance.  Respiratory: Negative for cough and shortness of breath.   Cardiovascular: Negative for chest pain.  Gastrointestinal: Negative for constipation and diarrhea.  Genitourinary: Negative for difficulty urinating.       No incontinence  Musculoskeletal: Negative for arthralgias and back pain.  Neurological: Negative for dizziness and headaches.  Hematological: Negative for adenopathy. Does not bruise/bleed easily.  Psychiatric/Behavioral: Positive for confusion.       Very talkative. Repeats herself at times. No behaviors at the facility    BP 118/62 (BP Location: Right Arm, Patient Position: Sitting, Cuff Size: Normal)   Pulse 76   Temp 97.3 F (36.3 C) (Temporal)   Resp 18   Ht  (1.651 m)   Wt 121 lb 0.6 oz (54.9 kg)   SpO2 99%   BMI 20.14 kg/m   Physical Exam    Constitutional: She appears well-developed and well-nourished.  Thin, tall, good posture  HENT:  Head: Normocephalic and atraumatic.  Mouth/Throat: Oropharynx is clear and moist.  Cardiovascular: Normal rate, regular rhythm and normal heart sounds.   Pulmonary/Chest: Effort normal and breath sounds normal.  Skin: Skin is warm and dry.  Psychiatric: She has a normal mood and affect. Her behavior is normal.  Jovial. Talkative. Mildly hyperkinetic    ASSESSMENT/PLAN:  1. PPD screening test  - TB Skin Test  2. Chronic constipation   Patient Instructions  See me in six months Call sooner for problems   Eustace Moore, MD

## 2016-08-27 DIAGNOSIS — H353131 Nonexudative age-related macular degeneration, bilateral, early dry stage: Secondary | ICD-10-CM | POA: Diagnosis not present

## 2016-10-22 ENCOUNTER — Encounter: Payer: Self-pay | Admitting: Family Medicine

## 2016-10-22 ENCOUNTER — Ambulatory Visit (INDEPENDENT_AMBULATORY_CARE_PROVIDER_SITE_OTHER): Payer: Medicare Other | Admitting: Family Medicine

## 2016-10-22 VITALS — BP 124/68 | HR 72 | Temp 97.6°F | Resp 18 | Ht 65.0 in | Wt 126.0 lb

## 2016-10-22 DIAGNOSIS — R413 Other amnesia: Secondary | ICD-10-CM | POA: Diagnosis not present

## 2016-10-22 DIAGNOSIS — M25562 Pain in left knee: Secondary | ICD-10-CM

## 2016-10-22 DIAGNOSIS — F0390 Unspecified dementia without behavioral disturbance: Secondary | ICD-10-CM | POA: Insufficient documentation

## 2016-10-22 DIAGNOSIS — M25561 Pain in right knee: Secondary | ICD-10-CM

## 2016-10-22 DIAGNOSIS — F0150 Vascular dementia without behavioral disturbance: Secondary | ICD-10-CM | POA: Insufficient documentation

## 2016-10-22 DIAGNOSIS — G8929 Other chronic pain: Secondary | ICD-10-CM | POA: Diagnosis not present

## 2016-10-22 NOTE — Patient Instructions (Signed)
May use an aspercreme rub on the knees when painful [name] should be allowed to have this in her room to apply as needed  Otherwise health is good Memory appears stable

## 2016-10-22 NOTE — Progress Notes (Signed)
    Chief Complaint  Patient presents with  . Weight Check   Patient is here at her own request to evaluate 2 problems. First, she states that she occasionally has stiffness and pain in her knees and wants to know if there is a cream she can rub on them. She currently lives in an assisted living center and needs an order to have cream in her room. No swelling, no falls. She thinks her knees hurt with "weather" Second issue is memory impairment. She feels it is getting worse. She saw an advertisement on television for a memory supplement. She wants to know whether this would be helpful for her. I told her it is not. Her best defense against progressive memory loss is good nutrition, regular exercise, and mental stimulation from reading, conversation, music, puzzles She is accompanied today by her niece. The niece voices no other concerns.  Patient Active Problem List   Diagnosis Date Noted  . Memory impairment 10/22/2016  . Chronic constipation 08/17/2016  . Immunization refused 02/24/2016  . Renal insufficiency 02/24/2016  . Pseudophakia of both eyes 02/24/2016  . Seasonal allergies 02/24/2016    Outpatient Encounter Prescriptions as of 10/22/2016  Medication Sig  . Sennosides (EX-LAX) 15 MG CHEW Chew by mouth.   No facility-administered encounter medications on file as of 10/22/2016.     No Known Allergies  Review of Systems  Unable to perform ROS: Dementia   She does state that she sleeps well, eats well, has no pain today, no complaints   BP 124/68 (BP Location: Right Arm, Patient Position: Sitting, Cuff Size: Normal)   Pulse 72   Temp 97.6 F (36.4 C) (Temporal)   Resp 18   Ht 5\' 5"  (1.651 m)   Wt 126 lb (57.2 kg)   SpO2 99%   BMI 20.97 kg/m   Physical Exam  Constitutional: She appears well-developed and well-nourished. No distress.  HENT:  Head: Normocephalic and atraumatic.  Mouth/Throat: Oropharynx is clear and moist.  Cardiovascular: Normal rate, regular  rhythm and normal heart sounds.   Pulmonary/Chest: Effort normal and breath sounds normal.  Musculoskeletal: Normal range of motion. She exhibits no edema.  Knee exam is normal. No warmth or effusion  Neurological: She is alert.  Psychiatric:  Patient talkative. Evasive. Difficult to direct. Obvious memory impairment.    ASSESSMENT/PLAN:  1. Memory impairment Seems unchanged. Stable  2. Chronic pain of both knees    Patient Instructions  May use an aspercreme rub on the knees when painful [name] should be allowed to have this in her room to apply as needed  Otherwise health is good Memory appears stable     Lisa MooreYvonne Sue Garvey Westcott, MD

## 2016-11-01 ENCOUNTER — Other Ambulatory Visit: Payer: Self-pay

## 2016-11-02 MED ORDER — SENNA 8.6 MG PO TABS: 1.0000 | ORAL_TABLET | Freq: Every day | ORAL | 3 refills | Status: DC | PRN

## 2016-11-02 NOTE — Progress Notes (Signed)
Done

## 2016-11-02 NOTE — Progress Notes (Signed)
yes

## 2016-11-11 ENCOUNTER — Telehealth: Payer: Self-pay | Admitting: Family Medicine

## 2016-11-11 NOTE — Telephone Encounter (Signed)
I have not seen anything

## 2016-11-11 NOTE — Telephone Encounter (Signed)
Lupita LeashDonna from [facility] faxed order for patient and has not received back yet. Her call back number is [phone]

## 2016-11-11 NOTE — Telephone Encounter (Signed)
Called and had order refaxed.

## 2016-11-23 ENCOUNTER — Other Ambulatory Visit: Payer: Self-pay | Admitting: Family Medicine

## 2017-01-17 ENCOUNTER — Encounter (HOSPITAL_COMMUNITY): Payer: Self-pay | Admitting: Emergency Medicine

## 2017-01-17 ENCOUNTER — Emergency Department (HOSPITAL_COMMUNITY): Payer: Medicare Other

## 2017-01-17 ENCOUNTER — Emergency Department (HOSPITAL_COMMUNITY)
Admission: EM | Admit: 2017-01-17 | Discharge: 2017-01-17 | Discharge status: Against Medical Advice | Payer: Medicare Other | Attending: Emergency Medicine | Admitting: Emergency Medicine

## 2017-01-17 DIAGNOSIS — R410 Disorientation, unspecified: Secondary | ICD-10-CM

## 2017-01-17 DIAGNOSIS — I10 Essential (primary) hypertension: Secondary | ICD-10-CM | POA: Diagnosis not present

## 2017-01-17 DIAGNOSIS — R4182 Altered mental status, unspecified: Secondary | ICD-10-CM | POA: Insufficient documentation

## 2017-01-17 DIAGNOSIS — R918 Other nonspecific abnormal finding of lung field: Secondary | ICD-10-CM | POA: Diagnosis not present

## 2017-01-17 HISTORY — DX: Other chronic pain: G89.29

## 2017-01-17 HISTORY — DX: Pain in unspecified knee: M25.569

## 2017-01-17 LAB — COMPREHENSIVE METABOLIC PANEL WITH GFR
ALT: 17 U/L (ref 14–54)
AST: 19 U/L (ref 15–41)
Albumin: 3.9 g/dL (ref 3.5–5.0)
Alkaline Phosphatase: 78 U/L (ref 38–126)
Anion gap: 7 (ref 5–15)
BUN: 27 mg/dL — ABNORMAL HIGH (ref 6–20)
CO2: 29 mmol/L (ref 22–32)
Calcium: 9.6 mg/dL (ref 8.9–10.3)
Chloride: 103 mmol/L (ref 101–111)
Creatinine, Ser: 1.21 mg/dL — ABNORMAL HIGH (ref 0.44–1.00)
GFR calc Af Amer: 45 mL/min — ABNORMAL LOW
GFR calc non Af Amer: 38 mL/min — ABNORMAL LOW
Glucose, Bld: 128 mg/dL — ABNORMAL HIGH (ref 65–99)
Potassium: 4.1 mmol/L (ref 3.5–5.1)
Sodium: 139 mmol/L (ref 135–145)
Total Bilirubin: 0.4 mg/dL (ref 0.3–1.2)
Total Protein: 7.1 g/dL (ref 6.5–8.1)

## 2017-01-17 LAB — CBC
HCT: 44.1 % (ref 36.0–46.0)
Hemoglobin: 14.8 g/dL (ref 12.0–15.0)
MCH: 30.5 pg (ref 26.0–34.0)
MCHC: 33.6 g/dL (ref 30.0–36.0)
MCV: 90.9 fL (ref 78.0–100.0)
Platelets: 202 10*3/uL (ref 150–400)
RBC: 4.85 MIL/uL (ref 3.87–5.11)
RDW: 13.2 % (ref 11.5–15.5)
WBC: 7 10*3/uL (ref 4.0–10.5)

## 2017-01-17 LAB — URINALYSIS, ROUTINE W REFLEX MICROSCOPIC
Bilirubin Urine: NEGATIVE
Glucose, UA: NEGATIVE mg/dL
Hgb urine dipstick: NEGATIVE
Ketones, ur: NEGATIVE mg/dL
LEUKOCYTES UA: NEGATIVE
Nitrite: NEGATIVE
PROTEIN: NEGATIVE mg/dL
SPECIFIC GRAVITY, URINE: 1.015 (ref 1.005–1.030)
pH: 5 (ref 5.0–8.0)

## 2017-01-17 LAB — TROPONIN I: Troponin I: <0.03 ng/mL

## 2017-01-17 LAB — PROTIME-INR
INR: 0.95
Prothrombin Time: 12.6 seconds (ref 11.4–15.2)

## 2017-01-17 LAB — ETHANOL: Alcohol, Ethyl (B): <5 mg/dL (ref <5)

## 2017-01-17 LAB — APTT: APTT: 37 s — AB (ref 24–36)

## 2017-01-17 LAB — CBG MONITORING, ED: Glucose-Capillary: 84 mg/dL (ref 65–99)

## 2017-01-17 NOTE — ED Notes (Signed)
Pt returned from MRI °

## 2017-01-17 NOTE — ED Triage Notes (Signed)
Patient brought in from [facility]. Per staff patient confused and states she doesn't feel right. Patient denies any pain but states vision "changes /illusions due to weather." Denies any weakness or changes in speech. Staff requesting to have urine checked. Patient aware of person, and time,  states that she is at doctors office in [place].

## 2017-01-17 NOTE — Discharge Instructions (Signed)
Take your usual prescriptions as previously directed. Your testing in the Emergency Department today did not indicate you had a stroke; however, you will need further testing to determine if your episode of confusion today was a "mini stroke."  Call your regular medical doctor tomorrow to schedule a follow up appointment within the next 2 days to determine if you need further testing as an outpatient.  Return to the Emergency Department immediately sooner if worsening.

## 2017-01-17 NOTE — ED Provider Notes (Signed)
[facility]Y DEPT Provider Note   CSN: [id number] Arrival date & time: 01/17/17  1241     History   Chief Complaint Chief Complaint  Patient presents with  . Altered Mental Status    HPI Lisa Harper is a 83 y.o. female.   Altered Mental Status      Pt was seen at 1710.  Per pt, c/o sudden onset and resolution of one episode of "confusion" that occurred this morning when she woke up at 8am. Pt states she looked at the clock and "was confused," "unable to figure out the time," and was unsteady on her feet when she tried to walk. Pt states her symptoms lasted approximately 30 minutes before resolving. Pt states she continues to feel back to baseline. Denies CP/palpitations, no SOB/cough, no visual changes, no focal motor weakness, no tingling/numbness in extremities, no slurred speech, no facial droop.    Past Medical History:  Diagnosis Date  . Cataract   . Chronic knee pain   . Essential hypertension    not currently on medicine  . History of cardiac murmur   . Mild cognitive impairment   . Pre-diabetes   . Renal insufficiency 02/24/2016    Patient Active Problem List   Diagnosis Date Noted  . Memory impairment 10/22/2016  . Chronic constipation 08/17/2016  . Immunization refused 02/24/2016  . Renal insufficiency 02/24/2016  . Pseudophakia of both eyes 02/24/2016  . Seasonal allergies 02/24/2016    Past Surgical History:  Procedure Laterality Date  . ABDOMINAL HYSTERECTOMY     for pain  . CATARACT EXTRACTION      OB History    Gravida Para Term Preterm AB Living             0   SAB TAB Ectopic Multiple Live Births                   Home Medications    Prior to Admission medications   Medication Sig Start Date End Date Taking? Authorizing Provider  senna (SENOKOT) 8.6 MG TABS tablet TAKE 1 TABLET BY MOUTH ONCE DAILY AS NEEDED FOR CONSTIPATION. 11/23/16   Eustace Moore, MD  Sennosides (EX-LAX) 15 MG CHEW Chew by mouth.    [provider]    Family History Family History  Problem Relation Age of Onset  . Suicidality Father     Social History Social History  Substance Use Topics  . Smoking status: Never Smoker  . Smokeless tobacco: Never Used  . Alcohol use No     Allergies   Patient has no known allergies.   Review of Systems Review of Systems ROS: Statement: All systems negative except as marked or noted in the HPI; Constitutional: Negative for fever and chills. ; ; Eyes: Negative for eye pain, redness and discharge. ; ; ENMT: Negative for ear pain, hoarseness, nasal congestion, sinus pressure and sore throat. ; ; Cardiovascular: Negative for chest pain, palpitations, diaphoresis, dyspnea and peripheral edema. ; ; Respiratory: Negative for cough, wheezing and stridor. ; ; Gastrointestinal: Negative for nausea, vomiting, diarrhea, abdominal pain, blood in stool, hematemesis, jaundice and rectal bleeding. . ; ; Genitourinary: Negative for dysuria, flank pain and hematuria. ; ; Musculoskeletal: Negative for back pain and neck pain. Negative for swelling and trauma.; ; Skin: Negative for pruritus, rash, abrasions, blisters, bruising and skin lesion.; ; Neuro: +confusion, ataxia. Negative for headache, lightheadedness and neck stiffness. Negative for weakness, altered level of consciousness, extremity weakness, paresthesias, involuntary movement, seizure and  syncope.       Physical Exam Updated Vital Signs BP (!) 191/70   Pulse 67   Temp 98.7 F (37.1 C)   Resp 16   Ht  (1.702 m)   Wt 57.2 kg (126 lb)   SpO2 99%   BMI 19.73 kg/m   Physical Exam 1715: Physical examination:  Nursing notes reviewed; Vital signs and O2 SAT reviewed;  Constitutional: Well developed, Well nourished, Well hydrated, In no acute distress; Head:  Normocephalic, atraumatic; Eyes: EOMI, PERRL, No scleral icterus; ENMT: TM's clear bilat. Mouth and pharynx normal, Mucous membranes moist; Neck: Supple, Full range of motion, No  lymphadenopathy; Cardiovascular: Regular rate and rhythm, No gallop; Respiratory: Breath sounds clear & equal bilaterally, No wheezes.  Speaking full sentences with ease, Normal respiratory effort/excursion; Chest: Nontender, Movement normal; Abdomen: Soft, Nontender, Nondistended, Normal bowel sounds; Genitourinary: No CVA tenderness; Extremities: Pulses normal, No tenderness, No edema, No calf edema or asymmetry.; Neuro: AA&Ox3, Major CN grossly intact. Speech clear.  No facial droop.  +left horizontal end gaze fatigable nystagmus. Grips equal. Strength 5/5 equal bilat UE's and LE's.  DTR 2/4 equal bilat UE's and LE's.  No gross sensory deficits.  Normal cerebellar testing bilat UE's (finger-nose) and LE's (heel-shin)..; Skin: Color normal, Warm, Dry.   ED Treatments / Results  Labs (all labs ordered are listed, but only abnormal results are displayed)   EKG  EKG Interpretation  Date/Time:  Monday January 17 2017 17:31:55 EDT Ventricular Rate:  73 PR Interval:    QRS Duration: 136 QT Interval:  447 QTC Calculation: 493 R Axis:   -6 Text Interpretation:  Sinus rhythm Right bundle branch block No old tracing to compare Confirmed by Samuel Jester [id number]) on 01/17/2017 5:43:18 PM       Radiology   Procedures Procedures (including critical care time)  Medications Ordered in ED Medications - No data to display   Initial Impression / Assessment and Plan / ED Course  I have reviewed the triage vital signs and the nursing notes.  Pertinent labs & imaging results that were available during my care of the patient were reviewed by me and considered in my medical decision making (see chart for details).  MDM Reviewed: previous chart, nursing note and vitals Interpretation: labs, ECG, x-ray and MRI   Results for orders placed or performed during the hospital encounter of 01/17/17  Comprehensive metabolic panel  Result Value Ref Range   Sodium 139 135 - 145 mmol/L   Potassium  4.1 3.5 - 5.1 mmol/L   Chloride 103 101 - 111 mmol/L   CO2 29 22 - 32 mmol/L   Glucose, Bld 128 (H) 65 - 99 mg/dL   BUN 27 (H) 6 - 20 mg/dL   Creatinine, Ser 5.28 (H) 0.44 - 1.00 mg/dL   Calcium 9.6 8.9 - 41.3 mg/dL   Total Protein 7.1 6.5 - 8.1 g/dL   Albumin 3.9 3.5 - 5.0 g/dL   AST 19 15 - 41 U/L   ALT 17 14 - 54 U/L   Alkaline Phosphatase 78 38 - 126 U/L   Total Bilirubin 0.4 0.3 - 1.2 mg/dL   GFR calc non Af Amer 38 (L) >60 mL/min   GFR calc Af Amer 45 (L) >60 mL/min   Anion gap 7 5 - 15  CBC  Result Value Ref Range   WBC 7.0 4.0 - 10.5 K/uL   RBC 4.85 3.87 - 5.11 MIL/uL   Hemoglobin 14.8 12.0 - 15.0 g/dL  HCT 44.1 36.0 - 46.0 %   MCV 90.9 78.0 - 100.0 fL   MCH 30.5 26.0 - 34.0 pg   MCHC 33.6 30.0 - 36.0 g/dL   RDW 16.1 09.6 - 04.5 %   Platelets 202 150 - 400 K/uL  Ethanol  Result Value Ref Range   Alcohol, Ethyl (B) <5 <5 mg/dL  Protime-INR  Result Value Ref Range   Prothrombin Time 12.6 11.4 - 15.2 seconds   INR 0.95   APTT  Result Value Ref Range   aPTT 37 (H) 24 - 36 seconds  Urinalysis, Routine w reflex microscopic  Result Value Ref Range   Color, Urine YELLOW YELLOW   APPearance CLEAR CLEAR   Specific Gravity, Urine 1.015 1.005 - 1.030   pH 5.0 5.0 - 8.0   Glucose, UA NEGATIVE NEGATIVE mg/dL   Hgb urine dipstick NEGATIVE NEGATIVE   Bilirubin Urine NEGATIVE NEGATIVE   Ketones, ur NEGATIVE NEGATIVE mg/dL   Protein, ur NEGATIVE NEGATIVE mg/dL   Nitrite NEGATIVE NEGATIVE   Leukocytes, UA NEGATIVE NEGATIVE  Troponin I  Result Value Ref Range   Troponin I <0.03 <0.03 ng/mL   Dg Chest 2 View Result Date: 01/17/2017 CLINICAL DATA:  Confusion EXAM: CHEST  2 VIEW COMPARISON:  None. FINDINGS: Mild hyperinflation. No acute pulmonary infiltrate or effusion. Cardiomediastinal silhouette within normal limits. Mild atherosclerotic calcification. No pneumothorax. IMPRESSION: No active cardiopulmonary disease. Electronically Signed   By: Jasmine Pang M.D.   On:  01/17/2017 18:12   Mr Brain Wo Contrast (neuro Protocol) Result Date: 01/17/2017 CLINICAL DATA:  Altered mental status, confusion. History of hypertension. EXAM: MRI HEAD WITHOUT CONTRAST TECHNIQUE: Multiplanar, multiecho pulse sequences of the brain and surrounding structures were obtained without intravenous contrast. Coronal T2 sequence not obtained. Patient could not tolerate further imaging and crawled out of the scanner. COMPARISON:  None. FINDINGS: Moderately motion degraded axial T1, severely motion degraded axial blood sensitive sequence. BRAIN: No reduced diffusion to suggest acute ischemia. No susceptibility artifact to suggest hemorrhage. The ventricles and sulci are normal for patient's age. No suspicious parenchymal signal, mass or mass effect. No abnormal extra-axial fluid collections. VASCULAR: Normal major intracranial vascular flow voids present at skull base. SKULL AND UPPER CERVICAL SPINE: No abnormal sellar expansion. No suspicious calvarial bone marrow signal. Craniocervical junction maintained. SINUSES/ORBITS: The mastoid air-cells and included paranasal sinuses are well-aerated. The included ocular globes and orbital contents are non-suspicious. Status post bilateral ocular lens implant. RIGHT suspected coloboma. OTHER: None. IMPRESSION: Negative motion degraded noncontrast MRI of the head for age. Electronically Signed   By: Awilda Metro M.D.   On: 01/17/2017 18:36    1855:   Pt informed re: dx testing results,and my concern for possible TIA as cause for her symptoms, and that I recommend admission for further evaluation.  Pt refuses admission.  I encouraged pt to stay, continues to refuse.  Pt makes her own medical decisions.  Risks of AMA explained to pt, including, but not limited to:  stroke, heart attack, cardiac arrythmia ("irregular heart rate/beat"), "passing out," temporary and/or permanent disability, death.  Pt verb understanding and continues to refuse admission,  understanding the consequences of her decision.  I encouraged pt to follow up with her PMD tomorrow and return to the ED immediately if symptoms return, or for any other concerns.  Pt verb understanding, agreeable.   Final Clinical Impressions(s) / ED Diagnoses   Final diagnoses:  None    New Prescriptions New Prescriptions   No medications  on file      Samuel Jester, DO 01/19/17 1610

## 2017-01-17 NOTE — ED Notes (Signed)
Patient transported to MRI 

## 2017-01-17 NOTE — ED Notes (Signed)
Patient states that she does not want to be admitted and will not stay.

## 2017-01-18 ENCOUNTER — Encounter: Payer: Self-pay | Admitting: Family Medicine

## 2017-01-18 ENCOUNTER — Ambulatory Visit (INDEPENDENT_AMBULATORY_CARE_PROVIDER_SITE_OTHER): Payer: Medicare Other | Admitting: Family Medicine

## 2017-01-18 VITALS — BP 138/68 | HR 66 | Temp 97.3°F | Wt 130.0 lb

## 2017-01-18 DIAGNOSIS — R413 Other amnesia: Secondary | ICD-10-CM

## 2017-01-18 NOTE — Progress Notes (Signed)
    Chief Complaint  Patient presents with  . Altered Mental Status  went to the ER yesterday for contusion Had spell of confusion in the morning that resolved spontaneously No recent fall or injury.  No recent infection or illness.  No urinary symptoms.  States she sleeps well She admits her memory is not what it once was.  Thinks it normal for her age. Is adjusting to her new rest home situation. Eats 3 meals a day. Complains about the news.  She worries about race relations and the shooting of young black med. She has a misconception that when she goes out she must be accompanied due to her "skin color".  I explained it is due to her age and frailty and not her color. Today she is back to baseline.   We reviewed her labs, MRI and chest x ray results   Patient Active Problem List   Diagnosis Date Noted  . Memory impairment 10/22/2016  . Chronic constipation 08/17/2016  . Immunization refused 02/24/2016  . Renal insufficiency 02/24/2016  . Pseudophakia of both eyes 02/24/2016  . Seasonal allergies 02/24/2016    Outpatient Encounter Prescriptions as of 01/18/2017  Medication Sig  . senna (SENOKOT) 8.6 MG TABS tablet TAKE 1 TABLET BY MOUTH ONCE DAILY AS NEEDED FOR CONSTIPATION.   No facility-administered encounter medications on file as of 01/18/2017.     No Known Allergies  Review of Systems  Constitutional: Negative for activity change, appetite change, fatigue and unexpected weight change.  HENT: Negative for congestion and dental problem.   Eyes: Negative for visual disturbance.  Respiratory: Negative for cough and shortness of breath.   Cardiovascular: Negative for chest pain.  Gastrointestinal: Negative for constipation and diarrhea.  Genitourinary: Negative for difficulty urinating and dysuria.  Musculoskeletal: Negative for arthralgias and back pain.  Neurological: Negative for dizziness and headaches.  Hematological: Negative for adenopathy. Does not bruise/bleed  easily.  Psychiatric/Behavioral: Positive for confusion.       Very talkative. Repeats herself at times. No behaviors at the facility      BP 138/68   Pulse 66   Temp (!) 97.3 F (36.3 C) (Temporal)   Wt 130 lb (59 kg)   SpO2 99%   BMI 20.36 kg/m   Physical Exam  Constitutional: She appears well-developed and well-nourished. No distress.  Good posture.  Normal gait  HENT:  Head: Normocephalic and atraumatic.  Mouth/Throat: Oropharynx is clear and moist.  Cardiovascular: Normal rate, regular rhythm and normal heart sounds.   Pulmonary/Chest: Effort normal and breath sounds normal.  Musculoskeletal: Normal range of motion. She exhibits no edema.  Neurological: She is alert.  Psychiatric:  Patient talkative. Evasive. Difficult to direct. Obvious memory impairment.    ASSESSMENT/PLAN:  1. Memory impairment Unchanged from my prior assessment  Discussed flu shot.  Recommended.  Refused.   Patient Instructions  See me as needed   Eustace Moore, MD

## 2017-01-18 NOTE — Patient Instructions (Signed)
See me as needed 

## 2017-01-19 ENCOUNTER — Telehealth: Payer: Self-pay | Admitting: Family Medicine

## 2017-01-19 DIAGNOSIS — F69 Unspecified disorder of adult personality and behavior: Secondary | ICD-10-CM

## 2017-01-19 LAB — URINE CULTURE

## 2017-01-19 NOTE — Telephone Encounter (Signed)
[facility], left message requesting referral to behavioral health.  Callback# [phone]

## 2017-01-20 NOTE — Telephone Encounter (Signed)
Patient refused at the visit.  If she agrees, we can send for a consultation.

## 2017-01-20 NOTE — Telephone Encounter (Signed)
Spoke with patient and she agreed to see physiatry.

## 2017-01-20 NOTE — Telephone Encounter (Signed)
I returned [name]'s call to see what additional information she could provide. All she states is that patient has 'behaviors,' she had 'behaviors at the office,' and that the 'guardian wants the referral.'

## 2017-01-20 NOTE — Telephone Encounter (Signed)
done

## 2017-01-21 ENCOUNTER — Encounter (HOSPITAL_COMMUNITY): Payer: Self-pay | Admitting: *Deleted

## 2017-01-21 ENCOUNTER — Emergency Department (HOSPITAL_COMMUNITY): Payer: Medicare Other

## 2017-01-21 ENCOUNTER — Emergency Department (HOSPITAL_COMMUNITY)
Admission: EM | Admit: 2017-01-21 | Discharge: 2017-01-22 | Disposition: A | Discharge status: Home / Self Care | Payer: Medicare Other | Attending: Emergency Medicine | Admitting: Emergency Medicine

## 2017-01-21 DIAGNOSIS — I10 Essential (primary) hypertension: Secondary | ICD-10-CM | POA: Insufficient documentation

## 2017-01-21 DIAGNOSIS — R4020 Unspecified coma: Secondary | ICD-10-CM | POA: Diagnosis not present

## 2017-01-21 DIAGNOSIS — R4182 Altered mental status, unspecified: Secondary | ICD-10-CM | POA: Diagnosis present

## 2017-01-21 DIAGNOSIS — Z79899 Other long term (current) drug therapy: Secondary | ICD-10-CM | POA: Insufficient documentation

## 2017-01-21 DIAGNOSIS — R404 Transient alteration of awareness: Secondary | ICD-10-CM | POA: Insufficient documentation

## 2017-01-21 LAB — CBC WITH DIFFERENTIAL/PLATELET
BASOS ABS: 0 10*3/uL (ref 0.0–0.1)
BASOS PCT: 0 %
EOS ABS: 0.1 10*3/uL (ref 0.0–0.7)
EOS PCT: 1 %
HCT: 44.3 % (ref 36.0–46.0)
Hemoglobin: 14.7 g/dL (ref 12.0–15.0)
LYMPHS PCT: 29 %
Lymphs Abs: 1.8 10*3/uL (ref 0.7–4.0)
MCH: 30.2 pg (ref 26.0–34.0)
MCHC: 33.2 g/dL (ref 30.0–36.0)
MCV: 91 fL (ref 78.0–100.0)
MONO ABS: 0.6 10*3/uL (ref 0.1–1.0)
Monocytes Relative: 10 %
Neutro Abs: 3.7 10*3/uL (ref 1.7–7.7)
Neutrophils Relative %: 60 %
PLATELETS: 197 10*3/uL (ref 150–400)
RBC: 4.87 MIL/uL (ref 3.87–5.11)
RDW: 13.2 % (ref 11.5–15.5)
WBC: 6.3 10*3/uL (ref 4.0–10.5)

## 2017-01-21 LAB — URINALYSIS, ROUTINE W REFLEX MICROSCOPIC
Bilirubin Urine: NEGATIVE
GLUCOSE, UA: NEGATIVE mg/dL
HGB URINE DIPSTICK: NEGATIVE
KETONES UR: NEGATIVE mg/dL
Leukocytes, UA: NEGATIVE
Nitrite: NEGATIVE
PROTEIN: NEGATIVE mg/dL
Specific Gravity, Urine: 1.016 (ref 1.005–1.030)
pH: 5 (ref 5.0–8.0)

## 2017-01-21 LAB — COMPREHENSIVE METABOLIC PANEL
ALBUMIN: 4.2 g/dL (ref 3.5–5.0)
ALK PHOS: 83 U/L (ref 38–126)
ALT: 15 U/L (ref 14–54)
AST: 19 U/L (ref 15–41)
Anion gap: 9 (ref 5–15)
BILIRUBIN TOTAL: 0.6 mg/dL (ref 0.3–1.2)
BUN: 25 mg/dL — AB (ref 6–20)
CALCIUM: 9.7 mg/dL (ref 8.9–10.3)
CO2: 29 mmol/L (ref 22–32)
Chloride: 104 mmol/L (ref 101–111)
Creatinine, Ser: 1.13 mg/dL — ABNORMAL HIGH (ref 0.44–1.00)
GFR calc Af Amer: 48 mL/min — ABNORMAL LOW (ref >60)
GFR calc non Af Amer: 42 mL/min — ABNORMAL LOW (ref >60)
GLUCOSE: 94 mg/dL (ref 65–99)
Potassium: 4.7 mmol/L (ref 3.5–5.1)
SODIUM: 142 mmol/L (ref 135–145)
TOTAL PROTEIN: 7.5 g/dL (ref 6.5–8.1)

## 2017-01-21 LAB — TSH: TSH: 1.922 u[IU]/mL (ref 0.350–4.500)

## 2017-01-21 NOTE — ED Notes (Signed)
Pt very agitated about having to stay against her will & they are trying to take away her rights. Pt escorted back to her room & informed she can not walk around the ER. Pt has a meal being prepared for her at this time.

## 2017-01-21 NOTE — ED Notes (Signed)
Pt pacing in the room & looking out into the ER.

## 2017-01-21 NOTE — ED Provider Notes (Addendum)
[facility] Provider Note   CSN: [id number] Arrival date & time: 01/21/17  1610     History   Chief Complaint Chief Complaint  Patient presents with  . V70.1    HPI Lisa Harper is a 83 y.o. female.  Patient presents after episode of paranoia and driving her vehicle from nursing home.  Patient is normally independent with mild cognitive impairment per nursing home staff. Patient's drove her car without a license or takes her vehicle. Patient reports she knew that she wanted to do this. Staff have not noticed anything else significant today except for mild irritability. Patient has mentioned that she thought people were going to hurt her.Patient was seen recently in the ER after altered mental status episode. Patient has known other neurologic concerns per patient or caregiver. No fevers or head injuries, no headache, no stroke sxs.        Past Medical History:  Diagnosis Date  . Cataract   . Chronic knee pain   . Essential hypertension    not currently on medicine  . History of cardiac murmur   . Mild cognitive impairment   . Pre-diabetes   . Renal insufficiency 02/24/2016    Patient Active Problem List   Diagnosis Date Noted  . Memory impairment 10/22/2016  . Chronic constipation 08/17/2016  . Immunization refused 02/24/2016  . Renal insufficiency 02/24/2016  . Pseudophakia of both eyes 02/24/2016  . Seasonal allergies 02/24/2016    Past Surgical History:  Procedure Laterality Date  . ABDOMINAL HYSTERECTOMY     for pain  . CATARACT EXTRACTION      OB History    Gravida Para Term Preterm AB Living             0   SAB TAB Ectopic Multiple Live Births                   Home Medications    Prior to Admission medications   Medication Sig Start Date End Date Taking? Authorizing Provider  senna (SENOKOT) 8.6 MG TABS tablet TAKE 1 TABLET BY MOUTH ONCE DAILY AS NEEDED FOR CONSTIPATION. 11/23/16  Yes Eustace Moore, MD    Family History Family  History  Problem Relation Age of Onset  . Suicidality Father     Social History Social History  Substance Use Topics  . Smoking status: Never Smoker  . Smokeless tobacco: Never Used  . Alcohol use No     Allergies   Patient has no known allergies.   Review of Systems Review of Systems  Constitutional: Negative for chills and fever.  HENT: Negative for congestion.   Eyes: Negative for visual disturbance.  Respiratory: Negative for shortness of breath.   Cardiovascular: Negative for chest pain.  Gastrointestinal: Negative for abdominal pain and vomiting.  Genitourinary: Negative for dysuria and flank pain.  Musculoskeletal: Negative for back pain, neck pain and neck stiffness.  Skin: Negative for rash.  Neurological: Negative for light-headedness and headaches.     Physical Exam Updated Vital Signs BP (!) 158/62 (BP Location: Right Arm)   Pulse 91   Temp 98.7 F (37.1 C) (Oral)   Resp 18   SpO2 98%   Physical Exam  Constitutional: She is oriented to person, place, and time. She appears well-developed and well-nourished.  HENT:  Head: Normocephalic and atraumatic.  Eyes: Conjunctivae are normal. Right eye exhibits no discharge. Left eye exhibits no discharge.  Neck: Normal range of motion. Neck supple. No tracheal deviation present.  Cardiovascular: Normal rate and regular rhythm.   Pulmonary/Chest: Effort normal and breath sounds normal.  Abdominal: Soft. She exhibits no distension. There is no tenderness. There is no guarding.  Musculoskeletal: She exhibits no edema.  Neurological: She is alert and oriented to person, place, and time. No cranial nerve deficit. Gait normal. GCS eye subscore is 4. GCS verbal subscore is 5. GCS motor subscore is 6.  5+ strength in UE and LE with f/e at major joints. Sensation to palpation intact in UE and LE. CNs 2-12 grossly intact.  EOMFI.  PERRL.   Finger nose and coordination intact bilateral.   Visual fields intact to finger  testing. No nystagmus AO to name, place, caregiver  Skin: Skin is warm. No rash noted.  Psychiatric: Her mood appears not anxious. Her affect is not angry. She is not aggressive, not withdrawn and not combative. Thought content is paranoid. She expresses no suicidal ideation. She expresses no suicidal plans.  Mild paranoid  Nursing note and vitals reviewed.    ED Treatments / Results  Labs (all labs ordered are listed, but only abnormal results are displayed) Labs Reviewed  COMPREHENSIVE METABOLIC PANEL - Abnormal; Notable for the following:       Result Value   BUN 25 (*)    Creatinine, Ser 1.13 (*)    GFR calc non Af Amer 42 (*)    GFR calc Af Amer 48 (*)    All other components within normal limits  CBC WITH DIFFERENTIAL/PLATELET  URINALYSIS, ROUTINE W REFLEX MICROSCOPIC  TSH    EKG  EKG Interpretation None       Radiology Ct Head Wo Contrast  Result Date: 01/21/2017 CLINICAL DATA:  Altered level of consciousness EXAM: CT HEAD WITHOUT CONTRAST TECHNIQUE: Contiguous axial images were obtained from the base of the skull through the vertex without intravenous contrast. COMPARISON:  MRI a 01/17/2017 FINDINGS: Brain: No acute territorial infarction, hemorrhage, or intracranial mass is visualized. Stable ventricle size. Mild to moderate atrophy. Vascular: No hyperdense vessels.  Carotid artery calcifications. Skull: Normal. Negative for fracture or focal lesion. Sinuses/Orbits: No acute finding. Other: None IMPRESSION: No CT evidence for acute intracranial abnormality. Electronically Signed   By: Jasmine Pang M.D.   On: 01/21/2017 18:18    Procedures Procedures (including critical care time)  Medications Ordered in ED Medications - No data to display   Initial Impression / Assessment and Plan / ED Course  I have reviewed the triage vital signs and the nursing notes.  Pertinent labs & imaging results that were available during my care of the patient were reviewed by me  and considered in my medical decision making (see chart for details).    Patient presents after driving her vehicle when she is not legally supposed to. Patient is at baseline per caregiver. Patient is at a nursing home however is fairly independent. Discussed screening altered mental workup and likely close outpatient follow-up.  Screening workup in the ER unremarkable. At this time I do feel patient has capacity to make decisions. Because this is recurrent issue the nursing home recommended assessment by Behavioral Health. Behavioral Health contacted and will assess the patient.  Patient is not IVCD at this time.  TTS is able to assess patient this evening, pending recs    Final Clinical Impressions(s) / ED Diagnoses   Final diagnoses:  Transient alteration of awareness    New Prescriptions New Prescriptions   No medications on file     Blane Ohara, MD 01/21/17  2224    Blane Ohara, MD 01/21/17 2322

## 2017-01-21 NOTE — Discharge Instructions (Signed)
If you were given medicines take as directed.  If you are on coumadin or contraceptives realize their levels and effectiveness is altered by many different medicines.  If you have any reaction (rash, tongues swelling, other) to the medicines stop taking and see a physician.    If your blood pressure was elevated in the ER make sure you follow up for management with a primary doctor or return for chest pain, shortness of breath or stroke symptoms.  Please follow up as directed and return to the ER or see a physician for new or worsening symptoms.  Thank you. Vitals:   01/21/17 1614  BP: (!) 158/62  Pulse: 91  Resp: 18  Temp: 98.7 F (37.1 C)  TempSrc: Oral  SpO2: 98%

## 2017-01-21 NOTE — ED Notes (Signed)
Offered food, pt did not want it. RN notified

## 2017-01-21 NOTE — ED Notes (Signed)
Spoke to a Production designer, theatre/television/film from [employer]. Wanting to know if pt has been seen by physiatrist. Informed we do not have pyhsc MD actually here. [facility] wants caregiver called before discharge.

## 2017-01-21 NOTE — ED Notes (Signed)
Pt informed that MD has put in for a evaluation by [facility].

## 2017-01-21 NOTE — ED Notes (Signed)
Pt sitting in chair & has laid head on stretcher. NAD noted.

## 2017-01-21 NOTE — ED Triage Notes (Signed)
Pt from [facility] but her house is near the facility and pt drove her car without a driver's license or tags for car.  Reported that pt thought people after her to hurt her.

## 2017-01-22 LAB — RAPID URINE DRUG SCREEN, HOSP PERFORMED
Amphetamines: NOT DETECTED
BARBITURATES: NOT DETECTED
BENZODIAZEPINES: NOT DETECTED
COCAINE: NOT DETECTED
OPIATES: NOT DETECTED
TETRAHYDROCANNABINOL: NOT DETECTED

## 2017-01-22 NOTE — BH Assessment (Signed)
Tele Assessment Note   Patient Name: Lisa Harper MRN: [id number] Referring Physician: Dr. Blane Ohara Location of Patient: [facility] Location of Provider: Behavioral Health TTS Department  Lisa Harper is an 83 y.o. female.  -Clinician talked to Dr. Jodi Mourning about patient.  Patient presents after episode of paranoia and driving her vehicle from nursing home.  Patient is normally independent with mild cognitive impairment per nursing home staff. Patient's drove her car without a license or takes her vehicle. Patient reports she knew that she wanted to do this. Staff have not noticed anything else significant today except for mild irritability. Patient has mentioned that she thought people were going to hurt her.Patient was seen recently in the ER after altered mental status episode. Patient has known other neurologic concerns per patient or caregiver.  Patient has a car with no tags at [facility] facility where she has resided for the past 5 months.  Patient today left the facility to drive to her home which is close by.  Staff at [facility] were concerned that patient may have left because she was feeling paranoid and thinking that people were after her.  Patient says she just wanted to visit her home.  She has staff with her and staff said that patient and she do visit her home to sort through books that patient wants to donate.  Patient acknowledges that she should have not driven w/o license or tags on car.    Patient had been picked up by police and brought back to [facility].  [facility] then sent patient to [facility] for an evaluation.  Patient denies any SI, HI or A/V hallucinations.  She does say that she has some depression and anxiety.  Pt says that she has no close family around her anymore because she has outlived them all.  She has a POA who "is from the state."  She expresses frustration with him because she says she needs him to interact with her more and plan things with her  input.  Patient says she needs help with paying her bills at the end of the month.  Her main complaint centers on her POA.    Patient does express some mild paranoia.  She said that she thinks that people may be trying to get to her things in her room.  She says there are no locks on the doors there.  She said she would talk to management about her concerns over security.  -Clinician discussed patient care with Nira Conn, FNP who said he would talk with patient which he did.  Barbara Cower said that patient was psychiatrically cleared.  There is no reason to pursue inpt care for patient.  Clinician attempted to call Dr. Devoria Albe but she is very busy.  Clinician informed [name], RN that patient was psychiatrically cleared.  Diagnosis: Mild cognitive impairment  Past Medical History:  Past Medical History:  Diagnosis Date  . Cataract   . Chronic knee pain   . Essential hypertension    not currently on medicine  . History of cardiac murmur   . Mild cognitive impairment   . Pre-diabetes   . Renal insufficiency 02/24/2016    Past Surgical History:  Procedure Laterality Date  . ABDOMINAL HYSTERECTOMY     for pain  . CATARACT EXTRACTION      Family History:  Family History  Problem Relation Age of Onset  . Suicidality Father     Social History:  reports that she has never smoked. She  has never used smokeless tobacco. She reports that she does not drink alcohol or use drugs.  Additional Social History:  Alcohol / Drug Use Pain Medications: See PTA medication list Prescriptions: See PTA medication list Over the Counter: See PTA medication list History of alcohol / drug use?: No history of alcohol / drug abuse  CIWA: CIWA-Ar BP: (!) 142/62 Pulse Rate: 80 COWS:    PATIENT STRENGTHS: (choose at least two) Ability for insight Average or above average intelligence Communication skills  Allergies: No Known Allergies  Home Medications:  (Not in a hospital  admission)  OB/GYN Status:  No LMP recorded. Patient is postmenopausal.  General Assessment Data Location of Assessment: [facility] TTS Assessment: In system Is this a Tele or Face-to-Face Assessment?: Tele Assessment Is this an Initial Assessment or a Re-assessment for this encounter?: Initial Assessment Marital status: Widowed Is patient pregnant?: No Pregnancy Status: No Living Arrangements: Other (Comment) (Pt living at [facility].) Can pt return to current living arrangement?: Yes Admission Status: Voluntary Is patient capable of signing voluntary admission?: Yes Referral Source: Self/Family/Friend (Police brought her to [facility].  ) Insurance type: [facility]     Crisis Care Plan Living Arrangements: Other (Comment) (Pt living at [facility].) Name of Psychiatrist: None Name of Therapist: None  Education Status Is patient currently in school?: No Highest grade of school patient has completed: College degree  Risk to self with the past 6 months Suicidal Ideation: No Has patient been a risk to self within the past 6 months prior to admission? : No Suicidal Intent: No Has patient had any suicidal intent within the past 6 months prior to admission? : No Is patient at risk for suicide?: No Suicidal Plan?: No Has patient had any suicidal plan within the past 6 months prior to admission? : No Access to Means: No What has been your use of drugs/alcohol within the last 12 months?: None Previous Attempts/Gestures: No How many times?: 0 Other Self Harm Risks: None Triggers for Past Attempts: None known Intentional Self Injurious Behavior: None Family Suicide History: No Recent stressful life event(s): Other (Comment) (Patient moved into [facility] 5 months ago.) Persecutory voices/beliefs?: Yes (Feels that some people may be talking about her.) Depression: No Depression Symptoms:  (Pt denies any depressive symptoms.) Substance abuse history and/or  treatment for substance abuse?: No Suicide prevention information given to non-admitted patients: Not applicable  Risk to Others within the past 6 months Homicidal Ideation: No Does patient have any lifetime risk of violence toward others beyond the six months prior to admission? : No Thoughts of Harm to Others: No Current Homicidal Intent: No Current Homicidal Plan: No Access to Homicidal Means: No Identified Victim: NO one History of harm to others?: No Assessment of Violence: None Noted Violent Behavior Description: None reported Does patient have access to weapons?: No Criminal Charges Pending?: No Does patient have a court date: No Is patient on probation?: No  Psychosis Hallucinations: None noted Delusions: None noted  Mental Status Report Appearance/Hygiene: Unremarkable Eye Contact: Good Motor Activity: Freedom of movement, Unremarkable Speech: Logical/coherent Level of Consciousness: Alert Mood: Pleasant Affect: Appropriate to circumstance Anxiety Level: Moderate Thought Processes: Coherent, Relevant Judgement: Unimpaired Orientation: Person, Place, Situation Obsessive Compulsive Thoughts/Behaviors: None  Cognitive Functioning Concentration: Decreased Memory: Recent Impaired, Remote Intact IQ: Average Insight: Fair Impulse Control: Poor Appetite: Good Weight Loss: 0 Weight Gain: 0 Sleep: No Change Total Hours of Sleep: 7 Vegetative Symptoms: None  ADLScreening [facility]  Assessment Services) Patient's cognitive ability adequate to safely complete daily activities?: Yes Patient able to express need for assistance with ADLs?: Yes Independently performs ADLs?: Yes (appropriate for developmental age)  Prior Inpatient Therapy Prior Inpatient Therapy: No Prior Therapy Dates: None Prior Therapy Facilty/Provider(s): None Reason for Treatment: None  Prior Outpatient Therapy Prior Outpatient Therapy: No Prior Therapy Dates: N/A Prior Therapy  Facilty/Provider(s): N/A Reason for Treatment: N/A Does patient have an ACCT team?: No Does patient have Intensive In-House Services?  : No Does patient have [facility] services? : No Does patient have P4CC services?: No  ADL Screening (condition at time of admission) Patient's cognitive ability adequate to safely complete daily activities?: Yes Is the patient deaf or have difficulty hearing?: No Does the patient have difficulty seeing, even when wearing glasses/contacts?: Yes (Some vision problems.) Does the patient have difficulty concentrating, remembering, or making decisions?: Yes Patient able to express need for assistance with ADLs?: Yes Does the patient have difficulty dressing or bathing?: Yes (Some help with bathing.) Independently performs ADLs?: Yes (appropriate for developmental age) Does the patient have difficulty walking or climbing stairs?: No Weakness of Legs: None Weakness of Arms/Hands: None       Abuse/Neglect Assessment (Assessment to be complete while patient is alone) Physical Abuse: Denies Verbal Abuse: Denies Sexual Abuse: Denies Exploitation of patient/patient's resources: Denies Self-Neglect: Denies     Merchant navy officer (For Healthcare) Does Patient Have a Medical Advance Directive?: Yes Does patient want to make changes to medical advance directive?: No - Patient declined    Additional Information 1:1 In Past 12 Months?: No CIRT Risk: No Elopement Risk: No Does patient have medical clearance?: Yes     Disposition:  Disposition Initial Assessment Completed for this Encounter: Yes Disposition of Patient: Other dispositions Other disposition(s):  (Pt to be reviewed with PA.)  This service was provided via telemedicine using a 2-way, interactive audio and video technology.  Names of all persons participating in this telemedicine service and their role in this encounter. Name:  Role:   Name:  Role:   Name:  Role:   Name:  Role:      Alexandria Lodge 01/22/2017 12:22 AM

## 2017-01-22 NOTE — ED Notes (Signed)
Spoke w/ Victorino Dike at [facility]. Advised pt had been medically cleared by EDP. & did not meet inpatient criteria for [facility] & pt would be returning to facility just as soon as EDP finished paperwork.

## 2017-01-22 NOTE — ED Provider Notes (Signed)
Patient left at change of shift to wait the results of her TTS consult.  Per Berna Spare, TTS patient does not meet inpatient criteria, she can be discharged back to her facility.  I talked to the patient at 12:50 AM. She states she's frustrated as she had to talk to so many people to convince them that she is okay. She was informed that her nursing facility required it before they would accept her back and she is accepting of that. Patient is sitting on the end of the stretcher in no distress.  Devoria Albe, MD, Concha Pyo, MD 01/22/17 [phone]

## 2017-01-22 NOTE — Consult Note (Signed)
Chart reviewed and case discussed with TTS. Patient examined via telepsych. On exam she is alert and oriented x 4, pleasant, and cooperative. She appears well groomed. Speech is clear, coherent, and a normal rate. Affect is euthymic and congruent. Denies any suicidal ideations, homicidal ideations, and audiovisual hallucinations. No indication that she is responding to internal stimuli. Patient does not meet inpatient criteria. Case reviewed with Dr. Lucianne Muss.

## 2017-01-25 ENCOUNTER — Telehealth: Payer: Self-pay | Admitting: *Deleted

## 2017-01-25 DIAGNOSIS — H3561 Retinal hemorrhage, right eye: Secondary | ICD-10-CM | POA: Diagnosis not present

## 2017-01-25 NOTE — Telephone Encounter (Signed)
Lisa Harper from [facility] # [phone]. Is requesting medication that may take the edge off.  Patient is becoming Paranoid & police where called & E.R. visit on 02/20/17, after patient tried running over staff  With her car. Staff has B.H. appointment in mid November & desperately seeking help to avoid having patient   IVF( involuntarily committed)

## 2017-01-27 ENCOUNTER — Other Ambulatory Visit: Payer: Self-pay | Admitting: Family Medicine

## 2017-01-27 MED ORDER — LORAZEPAM 0.5 MG PO TABS: ORAL_TABLET | ORAL | 0 refills | Status: DC

## 2017-01-27 NOTE — Telephone Encounter (Signed)
Discussed at length with [name] the trouble they are having.  She has memory impairment and , at times, erratic behavior.  She is upset with her "mind slipping".  [name] is requesting a low dose of something to give her when agitated.  We discussed using aricept or a low dose of citalopram for the symptoms.  She is scheduled with mental health next month.  I agreed to prescribe low dose ativan to be given for severe symptoms.  She MUST be supervised after to make sure she does not fall.  [name] has hired a Comptroller to be with Nicole Cella.  YSN

## 2017-01-27 NOTE — Telephone Encounter (Signed)
Called and spoke to tammy, aware rx sent to pharmacy for Kynzie.

## 2017-02-02 ENCOUNTER — Other Ambulatory Visit: Payer: Self-pay

## 2017-02-02 ENCOUNTER — Telehealth (HOSPITAL_COMMUNITY): Payer: Self-pay

## 2017-02-09 ENCOUNTER — Other Ambulatory Visit: Payer: Self-pay | Admitting: Family Medicine

## 2017-02-09 ENCOUNTER — Telehealth: Payer: Self-pay | Admitting: Family Medicine

## 2017-02-09 MED ORDER — LORAZEPAM 0.5 MG PO TABS: ORAL_TABLET | ORAL | 0 refills | Status: DC

## 2017-02-09 NOTE — Telephone Encounter (Signed)
[facility] to [facility] medication dosage Cb#: [phone]

## 2017-02-09 NOTE — Telephone Encounter (Signed)
I spoke with Lisa Harper, the administrator at [facility] where Lisa Harper now lives about her recent outbursts and behaviors.  We agreed that for her safety and comfort I would prescribe Ativan 0.5, a half tab to be taken BID as needed for symptoms.  They called yesterday for a refill and the nurse Lisa Harper informed us that she is being given the medicine BID.  I called Lisa Harper to clarify , and remind her of our conversation that the medicine was to be PRN only, and her answer is "but she is so much better now".  I explained the Beers criteria for prescribing to the elderly and that this medicine is not recommended due to the increased risk of falls.  I authorize a refill for PRN use only.  This is emphasized.  She hs an appointment with behavioral health next month and hopefully she can be put on a safer long term medicine.  YSN

## 2017-02-10 NOTE — Telephone Encounter (Signed)
Called and clarified ativan dose to be HALF tablet PRN

## 2017-02-22 NOTE — Progress Notes (Deleted)
Psychiatric Initial Adult Assessment   Patient Identification: Lisa Harper MRN:  [id number] Date of Evaluation:  02/22/2017 Referral Source: *** Chief Complaint:   Visit Diagnosis: No diagnosis found.  History of Present Illness:  ***  Per chart review, she visited ED on 9/24 after episode of confusion. She was brought from [facility] to ED again on 9/28 after the patient was picked up by the police in the setting of episode of paranoia and driving her vehicle from nursing home.  Per chart review, "Patient is normally independent with mild cognitive impairment per nursing home staff. Patient's drove her car without a license or takes her vehicle. Patient reports she knew that she wanted to do this. Staff have not noticed anything else significant today except for mild irritability. Patient has mentioned that she thought people were going to hurt her. " After this episode, she was prescribed on ativan 0.5 mg BID prn by Dr. Delton See.   Patient has a car with no tags at [facility] facility where she has resided for the past 5 months.  Patient today left the facility to drive to her home which is close by.  Staff at [facility] were concerned that patient may have left because she was feeling paranoid and thinking that people were after her.  Patient says she just wanted to visit her home.  She has staff with her and staff said that patient and she do visit her home to sort through books that patient wants to donate.  Patient acknowledges that she should have not driven w/o license or tags on car.    Brain MRI 01/17/2017 IMPRESSION: Negative motion degraded noncontrast MRI of the head for age. No reduced diffusion to suggest acute ischemia. No susceptibility artifact to suggest hemorrhage. The ventricles and sulci are normal for patient's age. No suspicious parenchymal signal, mass or mass effect. No abnormal extra-axial fluid Collections.  Associated Signs/Symptoms: Depression  Symptoms:  {DEPRESSION SYMPTOMS:20000} (Hypo) Manic Symptoms:  {BHH MANIC SYMPTOMS:22872} Anxiety Symptoms:  {BHH ANXIETY SYMPTOMS:22873} Psychotic Symptoms:  {BHH PSYCHOTIC SYMPTOMS:22874} PTSD Symptoms: {BHH PTSD SYMPTOMS:22875}  Past Psychiatric History:  Outpatient:  Psychiatry admission:  Previous suicide attempt:  Past trials of medication:  History of violence:   Previous Psychotropic Medications: {YES/NO:21197}  Substance Abuse History in the last 12 months:  {yes no:314532}  Consequences of Substance Abuse: {BHH CONSEQUENCES OF SUBSTANCE ABUSE:22880}  Past Medical History:  Past Medical History:  Diagnosis Date  . Cataract   . Chronic knee pain   . Essential hypertension    not currently on medicine  . History of cardiac murmur   . Mild cognitive impairment   . Pre-diabetes   . Renal insufficiency 02/24/2016    Past Surgical History:  Procedure Laterality Date  . ABDOMINAL HYSTERECTOMY     for pain  . CATARACT EXTRACTION      Family Psychiatric History: ***  Family History:  Family History  Problem Relation Age of Onset  . Suicidality Father     Social History:   Social History   Social History  . Marital status: Single    Spouse name: N/A  . Number of children: N/A  . Years of education: N/A   Social History Main Topics  . Smoking status: Never Smoker  . Smokeless tobacco: Never Used  . Alcohol use No  . Drug use: No  . Sexual activity: Not Currently   Other Topics Concern  . Not on file   Social History Narrative   Single,  Lives alone.  Never married, no children    Additional Social History: ***  Allergies:  No Known Allergies  Metabolic Disorder Labs: No results found for: HGBA1C, MPG No results found for: PROLACTIN No results found for: CHOL, TRIG, HDL, CHOLHDL, VLDL, LDLCALC   Current Medications: Current Outpatient Prescriptions  Medication Sig Dispense Refill  . LORazepam (ATIVAN) 0.5 MG tablet 1/2 tablet PO q 12  hours AS NEEDED for agitation or anxiety 30 tablet 0  . senna (SENOKOT) 8.6 MG TABS tablet TAKE 1 TABLET BY MOUTH ONCE DAILY AS NEEDED FOR CONSTIPATION. 30 tablet 0   No current facility-administered medications for this visit.     Neurologic: Headache: {BHH YES OR NO:22294} Seizure: {BHH YES OR NO:22294} Paresthesias:{BHH YES OR ZO:10960}O:22294}  Musculoskeletal: Strength & Muscle Tone: {desc; muscle tone:32375} Gait & Station: {PE GAIT ED AVWU:98119}ATL:22525} Patient leans: {Patient Leans:21022755}  Psychiatric Specialty Exam: ROS  There were no vitals taken for this visit.There is no height or weight on file to calculate BMI.  General Appearance: {Appearance:22683}  Eye Contact:  {BHH EYE CONTACT:22684}  Speech:  {Speech:22685}  Volume:  {Volume (PAA):22686}  Mood:  {BHH MOOD:22306}  Affect:  {Affect (PAA):22687}  Thought Process:  {Thought Process (PAA):22688}  Orientation:  {BHH ORIENTATION (PAA):22689}  Thought Content:  {Thought Content:22690}  Suicidal Thoughts:  {ST/HT (PAA):22692}  Homicidal Thoughts:  {ST/HT (PAA):22692}  Memory:  {BHH MEMORY:22881}  Judgement:  {Judgement (PAA):22694}  Insight:  {Insight (PAA):22695}  Psychomotor Activity:  {Psychomotor (PAA):22696}  Concentration:  {Concentration:21399}  Recall:  {BHH GOOD/FAIR/POOR:22877}  Fund of Knowledge:{BHH GOOD/FAIR/POOR:22877}  Language: {BHH GOOD/FAIR/POOR:22877}  Akathisia:  {BHH YES OR NO:22294}  Handed:  {Handed:22697}  AIMS (if indicated):  ***  Assets:  {Assets (PAA):22698}  ADL's:  {BHH JYN'W:29562}ADL'S:22290}  Cognition: {chl bhh cognition:304700322}  Sleep:  ***   Assessment  Plan  The patient demonstrates the following risk factors for suicide: Chronic risk factors for suicide include: {Chronic Risk Factors for ZHYQMVH:84696295}Suicide:30414011}. Acute risk factors for suicide include: {Acute Risk Factors for MWUXLKG:40102725}Suicide:30414012}. Protective factors for this patient include: {Protective Factors for Suicide DGUY:40347425}Risk:30414013}.  Considering these factors, the overall suicide risk at this point appears to be {Desc; low/moderate/high:110033}. Patient {ACTION; IS/IS ZDG:38756433}OT:21021397} appropriate for outpatient follow up.   Treatment Plan Summary: {CHL AMB BH MD TX IRJJ:8841660630}Plan:[phone]}   Neysa Hottereina Reeva Davern, MD 10/30/20188:47 AM

## 2017-02-23 ENCOUNTER — Ambulatory Visit: Payer: Medicare Other | Admitting: Family Medicine

## 2017-02-23 DIAGNOSIS — H04123 Dry eye syndrome of bilateral lacrimal glands: Secondary | ICD-10-CM | POA: Diagnosis not present

## 2017-02-23 DIAGNOSIS — H16223 Keratoconjunctivitis sicca, not specified as Sjogren's, bilateral: Secondary | ICD-10-CM | POA: Diagnosis not present

## 2017-02-25 ENCOUNTER — Ambulatory Visit (HOSPITAL_COMMUNITY): Payer: Medicare Other | Admitting: Psychiatry

## 2017-03-10 ENCOUNTER — Encounter: Payer: Self-pay | Admitting: Family Medicine

## 2017-03-10 ENCOUNTER — Ambulatory Visit (INDEPENDENT_AMBULATORY_CARE_PROVIDER_SITE_OTHER): Payer: Medicare Other | Admitting: Family Medicine

## 2017-03-10 ENCOUNTER — Other Ambulatory Visit: Payer: Self-pay

## 2017-03-10 VITALS — BP 122/68 | HR 76 | Temp 98.0°F | Resp 16 | Ht 65.0 in | Wt 136.0 lb

## 2017-03-10 DIAGNOSIS — Z2821 Immunization not carried out because of patient refusal: Secondary | ICD-10-CM | POA: Diagnosis not present

## 2017-03-10 DIAGNOSIS — R413 Other amnesia: Secondary | ICD-10-CM

## 2017-03-10 DIAGNOSIS — F919 Conduct disorder, unspecified: Secondary | ICD-10-CM | POA: Diagnosis not present

## 2017-03-10 DIAGNOSIS — K5909 Other constipation: Secondary | ICD-10-CM | POA: Diagnosis not present

## 2017-03-10 DIAGNOSIS — H353131 Nonexudative age-related macular degeneration, bilateral, early dry stage: Secondary | ICD-10-CM | POA: Diagnosis not present

## 2017-03-10 NOTE — Progress Notes (Signed)
Chief Complaint  Patient presents with  . Follow-up   Patient is here for unclear reasons.  Received a call from her nursing home this morning that she had cold symptoms.  Patient is here with her aide.  They both deny she has been having any cold symptoms.  No runny nose or sore throat, no fever.  The aide thinks perhaps she is being seen for knee pain.  Patient states she has no knee pain. The patient has had some behavior disturbances at the nursing home.  I referred her for psychiatry evaluation.  The chart indicates that she did not keep the appointment.  With the aid tells me that the psychiatrist refused to see her without her guardian.  They are still trying to reach the guardian to schedule an appointment that is suitable for everyone. At the request of the nursing home I did call in a limited number of Ativan to give her as needed severe anxiety.  The nursing home gave it to her twice a day regularly, and call back in 2 weeks for a refill.  I explained to them that it was only to be used as needed for anxiety and was told by the nurse "well, she has anxiety twice a day so we give it to her".  I did refill the Ativan 1 time only until she can get in with a psychiatrist.  I am not going to refill it further.  No request is made for Ativan today.  I do not believe she needs to be medicated around the clock, but supervised and encouraged to engage in activity to keep her from becoming anxious.  This is discussed with her aide. [facility] does express some ideas that sound paranoid.  She worries a lot about what she reads about in the news and sees on the news television.  She has become more anxious and somewhat paranoid as she gets older.  This is 1 of the issues that I wish to be managed by psychiatry.  I do not believe it is safe or indicated to leave her on benzodiazepine medications, she may do better on an antidepressant.  Patient Active Problem List   Diagnosis Date Noted  . Behavior  disturbance 03/10/2017  . Memory impairment 10/22/2016  . Chronic constipation 08/17/2016  . Immunization refused 02/24/2016  . Renal insufficiency 02/24/2016  . Pseudophakia of both eyes 02/24/2016  . Seasonal allergies 02/24/2016    Outpatient Encounter Medications as of 03/10/2017  Medication Sig  . LORazepam (ATIVAN) 0.5 MG tablet 1/2 tablet PO q 12 hours AS NEEDED for agitation or anxiety  . senna (SENOKOT) 8.6 MG TABS tablet TAKE 1 TABLET BY MOUTH ONCE DAILY AS NEEDED FOR CONSTIPATION.   No facility-administered encounter medications on file as of 03/10/2017.     No Known Allergies  Review of Systems  Constitutional: Negative for activity change, appetite change, fatigue and unexpected weight change.  HENT: Negative for congestion and dental problem.   Eyes: Negative for visual disturbance.  Respiratory: Negative for cough and shortness of breath.   Cardiovascular: Negative for chest pain.  Gastrointestinal: Negative for constipation and diarrhea.  Genitourinary: Negative for difficulty urinating and dysuria.  Musculoskeletal: Negative for arthralgias and back pain.  Neurological: Negative for dizziness and headaches.  Hematological: Negative for adenopathy. Does not bruise/bleed easily.  Psychiatric/Behavioral: Negative for confusion.       Very talkative. Repeats herself at times.  No recent behavior   Absolutely no complaint today  BP 122/68 (BP Location: Left Arm, Patient Position: Sitting, Cuff Size: Normal)   Pulse 76   Temp 98 F (36.7 C) (Temporal)   Resp 16   Ht 5\' 5"  (1.651 m)   Wt 136 lb 0.6 oz (61.7 kg)   SpO2 98%   BMI 22.64 kg/m   Physical Exam  Constitutional: She appears well-developed and well-nourished. No distress.  Good posture.  Normal gait  HENT:  Head: Normocephalic and atraumatic.  Mouth/Throat: Oropharynx is clear and moist.  Cardiovascular: Normal rate, regular rhythm and normal heart sounds.  Pulmonary/Chest: Effort normal and  breath sounds normal.  Musculoskeletal: Normal range of motion. She exhibits no edema.  Neurological: She is alert.  Psychiatric:  Patient talkative. Evasive. Difficult to direct. Obvious memory impairment.    ASSESSMENT/PLAN:  1. Chronic constipation Managed  2. Immunization refused We had a discussion about flu shots.  I explained to her that since she has no institutionalize she is at high risk of influenza.  In addition with her age she is at higher risk for influenza complications.  She absolutely refuses  3. Memory impairment Documentation  4. Behavior disturbance None recent   Patient Instructions  Continue to eat well and stay active No change in medicines You need to re schedule the appointment [facility] Return as needed   Lisa MooreYvonne Sue Nelson, MD

## 2017-03-10 NOTE — Patient Instructions (Addendum)
Continue to eat well and stay active No change in medicines You need to re schedule the appointment with [facility] Return as needed

## 2017-03-28 ENCOUNTER — Telehealth: Payer: Self-pay | Admitting: *Deleted

## 2017-03-28 ENCOUNTER — Ambulatory Visit (INDEPENDENT_AMBULATORY_CARE_PROVIDER_SITE_OTHER): Payer: Medicare Other

## 2017-03-28 DIAGNOSIS — R4689 Other symptoms and signs involving appearance and behavior: Secondary | ICD-10-CM | POA: Diagnosis not present

## 2017-03-28 DIAGNOSIS — N390 Urinary tract infection, site not specified: Secondary | ICD-10-CM | POA: Diagnosis not present

## 2017-03-28 DIAGNOSIS — R319 Hematuria, unspecified: Secondary | ICD-10-CM | POA: Diagnosis not present

## 2017-03-28 LAB — POCT URINALYSIS DIPSTICK
Bilirubin, UA: NEGATIVE
GLUCOSE UA: NEGATIVE
Ketones, UA: NEGATIVE
Nitrite, UA: NEGATIVE
Protein, UA: NEGATIVE
SPEC GRAV UA: 1.02 (ref 1.010–1.025)
UROBILINOGEN UA: 0.2 U/dL
pH, UA: 6 (ref 5.0–8.0)

## 2017-03-28 NOTE — Telephone Encounter (Signed)
Lupita LeashDonna from [facility] called requesting a order for UTI, Lupita LeashDonna states patient is not her normal, patient is trying to leave the building, and pulling hose over her socks. Please advise [phone] fax [phone] Attn: Lupita Leashonna

## 2017-03-28 NOTE — Telephone Encounter (Signed)
Called and spoke to Lisa Harper, she is bringing a  urine spec here to the office.

## 2017-03-29 LAB — URINE CULTURE
MICRO NUMBER:: 81356442
RESULT: NO GROWTH
SPECIMEN QUALITY:: ADEQUATE

## 2017-03-31 NOTE — Progress Notes (Signed)
Called facility, aware.

## 2017-04-05 ENCOUNTER — Ambulatory Visit (HOSPITAL_COMMUNITY): Payer: Self-pay | Admitting: Psychiatry

## 2017-04-11 ENCOUNTER — Telehealth: Payer: Self-pay | Admitting: Family Medicine

## 2017-04-11 NOTE — Telephone Encounter (Signed)
I am asked to speak on the phone with [name]/nursing director at [facility] [place].  She is calling to request a prescription be called in for Lisa [name] for lorazepam 0.5 mg 1 p.o. twice daily.  She has already discussed the reason for this prescription refusal with Dr. [name], [name].  She is requesting to speak with me at this time.  [name] from [facility], reports that patient is "off the chain" has not had any lorazepam since 03/30/17. Reports that yesterday patient has had a change in her behavior.  Reports that today she has called 911 approximately 5 times, it is trying to walk out of the building, and she is acting paranoid and having uncontrollable behavior.  The nursing director reports reports that she believes if she could give the patient lorazepam 0.5 mg bid that it would helpto control her behavior and calm her down.  [name] reports that the last time patient was given lorazepam was 03/30/2017.  She reports that [name] did not want her to be dependent on the medication and was worried about the medication in a geriatric patient so she referred her to behavioral health.  She has not been seen at behavioral health because reportedly the appt was rescheduled due to snow.  [name] reports that the patient's vitals are stable.  However she is agitated and paranoid  Patient reportedly does have baseline dementia, mild cognitive impairment and renal insufficiency.   Reportedly patient was fine yesterday and has been doing ok until today when her mental status changed. Nursing director would like script for ativan.  I discussed with [name]/nursing director in detail that my concern was that patient has had such abrupt mental status changes when she has been doing well for the past 2 weeks.  My concern is that I do not know this patient personally and [name] had taken patient off of the Lorazepam for a reason.  I told [name] that my  concern is that patient could have an underlying infection, metabolic disturbance, or another process causing her behavior and mental status to change so quickly.  It is my recommendation and it was discussed with the nursing director today that patient be transferred to the emergency department for evaluation and workup.  If then it is deemed that patient does not have any acute metabolic or infectious process that is contributing to her mental status decline, then it may be appropriate to give her a small dose of benzodiazepines to be used on a as needed basis for her mood.  [name] reports that she will have patient transferred [facility] for evaluation at this time.

## 2017-04-21 ENCOUNTER — Telehealth: Payer: Self-pay | Admitting: Family Medicine

## 2017-04-21 NOTE — Telephone Encounter (Signed)
Lisa Harper is calling in-regards to Lisa Harper,  She wants to speak with you directly regarding the patient. [facility] [phone]

## 2017-04-21 NOTE — Telephone Encounter (Signed)
Please call and see if you can find out the issue for me

## 2017-04-21 NOTE — Telephone Encounter (Signed)
Called and spoke to Lupita LeashDonna, states Vesta has not been to behavioral health yet as her POA couldn't go. She said shes been calling 911, very confused, trying to leave facility. She said Conita has an appt on 1 14 19  to see behavioral health. Is asking for enough meds until her appt.

## 2017-04-21 NOTE — Telephone Encounter (Signed)
I do not feel comfortable refilling the Ativan.  When patient becomes agitated, they need to supervise her, take her for walks, perhaps get her sitter.  Behavior modification is the preferred treatment.  I feel by keeping her medicated, the POA is not motivated to assist.  If she becomes unbearable, take her to the emergency room for behavioral health assessment

## 2017-04-22 NOTE — Telephone Encounter (Signed)
Lupita LeashDonna made aware per Merry ProudBrandi

## 2017-04-25 ENCOUNTER — Other Ambulatory Visit: Payer: Self-pay | Admitting: Family Medicine

## 2017-04-25 MED ORDER — LORAZEPAM 0.5 MG PO TABS: ORAL_TABLET | ORAL | 0 refills | Status: DC

## 2017-04-25 NOTE — Telephone Encounter (Signed)
tammy  Firefighter- director [facility] called. States [facility] continues with anxiety, and behaviors, she states she is afraid she will hurt herself or others.she also says they have a 1:1 sitter for Lisa Harper that takes her places. She states " Dr [name] is her pcp, and I document when I call there". I reiterated to her previous note , and your suggestion to take Lisa Harper to the ED for eval. [name] stated " they dont do anything there for her, and the ativan is a low does and it has no side effects on her and helps her a lot". She states Lisa Harper has an appt on 1 14 19  for behavioral health and poa is aware.

## 2017-04-25 NOTE — Telephone Encounter (Signed)
Called and spoke to [place] at [facility], aware of rx for ativan. And directions for use.

## 2017-04-25 NOTE — Telephone Encounter (Signed)
I called [name] at [facility] [place].  She wanted to discuss medication use [facility].  I am aware of her conversation with my nurse documented below.  I am having some challenge expressing to [name] and her nurses that I do not want Ms. Haight on daily lorazepam.  This was a short-term medication given to her until she could get appropriate neuropsychiatric testing.  This was ordered over 3 months ago and has not yet been accomplished.  I do not know if her symptoms are psychiatric, dementia, behavioral, situational.  I have been unable to do an accurate MMSE.  I do not know her past history and her behavior and answers to my questions are often difficult to pinpoint a definitive diagnosis.  I do not know whether she needs an anxiolytic, cholinesterase inhibitor, an anti-depression, or antipsychotic.  Thus my recommendation that she see a specialist. I have explained to [name] and to her nurse Lupita LeashDonna that daily benzodiazepine use an 83 year old is not recommended.  Using benzodiazepines to control behavioral disturbances in the elderly is not recommended.  I fear that they are giving her this medicine twice a day to keep her complacent rather than using the behavioral techniques, exercise, music, aromatherapy, distraction that I have recommended.  [name] tells me that she has called the patient's legal guardian for discussion.  She states the legal guardian wants to speak with me.  I happily called Rosine AbeRon Hardy [phone]) for discussion.  He thanked me for medical care and agrees with my assessment.  He further requested that I comply with her request to give her medication for emergencies until she could be seen on January 14. It is my decision to prescribe 7 pills only of the Ativan to take 1/2 pill no more than once a day and only for signs and symptoms of anxiety/agitation/paranoia.  This will not be refilled.

## 2017-05-03 NOTE — Progress Notes (Signed)
Psychiatric Initial Adult Assessment   Patient Identification: Lisa Harper MRN:  [id number] Date of Evaluation:  05/09/2017 Referral Source: Dr. Blanchie Serve Chief Complaint:  "I have never met him (her guardian)" Visit Diagnosis:    ICD-10-CM   1. Major neurocognitive disorder F01.50 Vitamin B12    Folate  2. Folate deficiency E53.8 Folate  3. Vitamin B 12 deficiency E53.8 Vitamin B12    History of Present Illness:   Lisa Harper is a 84 y.o. year old female with a history of r/o neurocognitive disorder, who is referred for behavioral disturbance and paranoia.   Per chart review, [facility] has been requesting ativan from PCP given behavioral disturbance which worsened in the setting of being off ativan.   Patient presents with her Legal guardian,  Lisa Harper [phone]) and staff at [facility].   Patient is a very poor historian. She is unable to elaborate any story, although she occasionally agrees what the staff at [facility] says about her.  She states that she is not sure why she is at this appointment.  When she is asked the permission for her guardian to be at the room, she states that "people should be interested in me." "I don't want to discuss sensitive things." She states that she needs to get attention and not him.  She is not redirectable and ruminates on this issues. (Although she later agrees for her guardian to come inside, she ruminated on it is the first time she meets with her guardian.) She states that she is doing well and denies mood symptoms. She also states that she does not trust anybody including staff at [facility]. She denies insomnia. She denies feeling depressed or anxious. She denies SI, HI, AH/VH. She thanked to this Multimedia programmer when her interview is finished.   The staff at [facility] states that she has been at the facility for the past few months. Although she used to live by herself, she brought herself to the facility, stating that it  has been difficult for her to take care of herself. She had an incident of trying to drive (used to have her car) despite being advised not to. The police was involved that time. She had another episode of being called the police when she left the facility without approval; she is concerned that people might steal something from her at her house, which is across the facility. Although she has arrangement that she can visit her house regularly with her sitter, she states that they are "not trustworthy" and leaves the facility. She also started to refuse to eat at times, as she is concerned that somebody might have put something into her meals. Although she becomes combative at times, no significant behavioral issues except two incident described as above. Unknown sleeping time. No wandering at night. She was appointed a guardian about a month ago.   Per medication list from [facility], she has been given ativan 0.25 mg BID prn for anxiety; unclear how often she actually received.    Functional Status Instrumental Activities of Daily Living (IADLs):  Lisa Harper is independent in the following: managing finances, medications, driving Requires assistance with the following: none  Activities of Daily Living (ADLs):  Lisa Harper is independent in the following:  Grooming, walking,   Dependent: Incontinence, bathing,   I have utilized the [facility] Harper Controlled Substances Reporting System ([name]) to confirm adherence regarding the patient's medication. My review reveals appropriate prescription fills.   Wt  Readings from Last 3 Encounters:  05/09/17 132 lb (59.9 kg)  03/10/17 136 lb 0.6 oz (61.7 kg)  01/18/17 130 lb (59 kg)    Associated Signs/Symptoms: Depression Symptoms:  denies (Hypo) Manic Symptoms:  denies Anxiety Symptoms:  denies Psychotic Symptoms:  Delusions, Paranoia, PTSD Symptoms: Negative  Past Psychiatric History:  Outpatient: unknown Psychiatry admission:  Previous suicide  attempt:  Past trials of medication: ativan History of violence:   Previous Psychotropic Medications: Yes   Substance Abuse History in the last 12 months:  No.  Consequences of Substance Abuse: NA  Past Medical History:  Past Medical History:  Diagnosis Date  . Cataract   . Chronic knee pain   . Essential hypertension    not currently on medicine  . History of cardiac murmur   . Mild cognitive impairment   . Pre-diabetes   . Renal insufficiency 02/24/2016    Past Surgical History:  Procedure Laterality Date  . ABDOMINAL HYSTERECTOMY     for pain  . CATARACT EXTRACTION      Family Psychiatric History:  unknown  Family History:  Family History  Problem Relation Age of Onset  . Suicidality Father     Social History:   Social History   Socioeconomic History  . Marital status: Single    Spouse name: None  . Number of children: None  . Years of education: None  . Highest education level: None  Social Needs  . Financial resource strain: None  . Food insecurity - worry: None  . Food insecurity - inability: None  . Transportation needs - medical: None  . Transportation needs - non-medical: None  Occupational History  . None  Tobacco Use  . Smoking status: Never Smoker  . Smokeless tobacco: Never Used  Substance and Sexual Activity  . Alcohol use: No  . Drug use: No  . Sexual activity: Not Currently  Other Topics Concern  . None  Social History Narrative   Single,  Lives alone.  Never married, no children    Additional Social History:  Lives at [facility] Never married, no children  Allergies:  No Known Allergies  Metabolic Disorder Labs: No results found for: HGBA1C, MPG No results found for: PROLACTIN No results found for: CHOL, TRIG, HDL, CHOLHDL, VLDL, LDLCALC   Current Medications: Current Outpatient Medications  Medication Sig Dispense Refill  . carboxymethylcellulose (REFRESH PLUS) 0.5 % SOLN Place 1 drop into both eyes 3 (three) times  daily as needed.    . senna (SENOKOT) 8.6 MG TABS tablet TAKE 1 TABLET BY MOUTH ONCE DAILY AS NEEDED FOR CONSTIPATION. 30 tablet 0  . QUEtiapine (SEROQUEL) 25 MG tablet 25 mg at night. She may take additional 25 mg as needed for agitation, paranoia 60 tablet 0   No current facility-administered medications for this visit.     Neurologic: Headache: No Seizure: No Paresthesias:No  Musculoskeletal: Strength & Muscle Tone: decreased Gait & Station: normal Patient leans: N/A  Psychiatric Specialty Exam: Review of Systems  Unable to perform ROS: Dementia    Blood pressure (!) 146/76, pulse 72, height 5' 6" (1.676 m), weight 132 lb (59.9 kg), SpO2 99 %.Body mass index is 21.31 kg/m.  General Appearance: Fairly Groomed  Eye Contact:  Good  Speech:  Clear and Coherent  Volume:  Normal  Mood:  "fine"  Affect:  Blunt  Thought Process:  Disorganized  Orientation:  Other:  "medical office" "13th, Feb, (unable to tell year)"  Thought Content:  Delusions and Rumination  Suicidal Thoughts:  No  Homicidal Thoughts:  No  Memory:  Immediate;   Poor  Judgement:  Impaired  Insight:  Lacking  Psychomotor Activity:  Normal  Concentration:  Concentration: Fair and Attention Span: Poor  Recall:  Poor  Fund of Knowledge:Fair  Language: Fair  Akathisia:  No  Handed:  Right  AIMS (if indicated):  Not done  Assets:  Social Support  ADL's:  Impaired  Cognition: Impaired,  Severe  Sleep:  fair   Imaging Head MRI 01/17/2017  FINDINGS: Moderately motion degraded axial T1, severely motion degraded axial blood sensitive sequence.  BRAIN: No reduced diffusion to suggest acute ischemia. No susceptibility artifact to suggest hemorrhage. The ventricles and sulci are normal for patient's age. No suspicious parenchymal signal, mass or mass effect. No abnormal extra-axial fluid collections.  VASCULAR: Normal major intracranial vascular flow voids present at skull base.  IMPRESSION: Negative  motion degraded non contrast MRI of the head for age.  Head CT 12/2016 FINDINGS: Brain: No acute territorial infarction, hemorrhage, or intracranial mass is visualized. Stable ventricle size. Mild to moderate atrophy.  Assessment Alejah Aristizabal is a 84 y.o. year old female with a history of r/o neurocognitive disorder, who is referred for behavioral disturbance and paranoia.   # Major neurocognitive disorder Exam is notable for significant disorganization, paranoia and rumination; she is hard to redirect, although she does not demonstrate significant behavioral issues at the interview. The staff at [facility] also reports paranoia and agitation secondary to paranoia; these symptoms are consistent with alzheimer's disease, although it is difficult to diagnose without information of timeline of her symptoms. Will start quetiapine to target paranoia and sleep (we have limited information regarding her sleep). Discussed with staff and the guardian regarding the risk of drowsiness and increased mortality for people with dementia. Will have additional dose prn for agitation. Will discontinue ativan given its risk of fall/confusion. Will not start Aricept/memantine at this time given her severity of cognitive impairment and its potential side effect. Will obtain MOCA at the next visit. Will obtain blood test to rule out medical cause of dementia.   Plan 1. Discontinue Ativan  2. Start quetiapine 25 mg at night. She may take additional 25 mg during the day for paranoia, agitation 3. Check Vitamin B 12, folate (TSH wnl) 4. Return to clinic in one month for 30 mins - Will do Lyman at the next evaluation  The patient demonstrates the following risk factors for suicide: Chronic risk factors for suicide include: psychiatric disorder of dementia. Acute risk factors for suicide include: unemployment. Protective factors for this patient include: positive social support. Considering these factors, the overall  suicide risk at this point appears to be low. Patient is appropriate for outpatient follow up.   Treatment Plan Summary: Plan as above   Norman Clay, MD 1/14/201911:42 AM

## 2017-05-04 MED ORDER — LORAZEPAM 0.5 MG PO TABS: ORAL_TABLET | ORAL | 0 refills | Status: DC

## 2017-05-09 ENCOUNTER — Encounter (HOSPITAL_COMMUNITY): Payer: Self-pay | Admitting: Psychiatry

## 2017-05-09 ENCOUNTER — Encounter (INDEPENDENT_AMBULATORY_CARE_PROVIDER_SITE_OTHER): Payer: Self-pay

## 2017-05-09 ENCOUNTER — Ambulatory Visit (INDEPENDENT_AMBULATORY_CARE_PROVIDER_SITE_OTHER): Payer: Medicare Other | Admitting: Psychiatry

## 2017-05-09 VITALS — BP 146/76 | HR 72 | Ht 66.0 in | Wt 132.0 lb

## 2017-05-09 DIAGNOSIS — F0390 Unspecified dementia without behavioral disturbance: Secondary | ICD-10-CM

## 2017-05-09 DIAGNOSIS — Z810 Family history of intellectual disabilities: Secondary | ICD-10-CM | POA: Diagnosis not present

## 2017-05-09 DIAGNOSIS — F0150 Vascular dementia without behavioral disturbance: Secondary | ICD-10-CM

## 2017-05-09 DIAGNOSIS — E538 Deficiency of other specified B group vitamins: Secondary | ICD-10-CM

## 2017-05-09 MED ORDER — QUETIAPINE FUMARATE 25 MG PO TABS
ORAL_TABLET | ORAL | 0 refills | Status: DC
Start: 2017-05-09 — End: 2017-06-09

## 2017-05-09 NOTE — Patient Instructions (Signed)
1. Discontinue Ativan  2. Start quetiapine 25 mg at night. She may take additional 25 mg during the day for paranoia, agitation 3. Check Vitamin B 12, folate 4. Return to clinic in one month for 30 mins

## 2017-05-10 LAB — FOLATE: FOLATE: 18.8 ng/mL

## 2017-05-10 LAB — VITAMIN B12: Vitamin B-12: 562 pg/mL (ref 200–1100)

## 2017-05-25 ENCOUNTER — Emergency Department (HOSPITAL_COMMUNITY)
Admission: EM | Admit: 2017-05-25 | Discharge: 2017-05-25 | Disposition: A | Discharge status: Home / Self Care | Payer: Medicare Other | Attending: Emergency Medicine | Admitting: Emergency Medicine

## 2017-05-25 ENCOUNTER — Encounter (HOSPITAL_COMMUNITY): Payer: Self-pay

## 2017-05-25 DIAGNOSIS — G3184 Mild cognitive impairment, so stated: Secondary | ICD-10-CM | POA: Insufficient documentation

## 2017-05-25 DIAGNOSIS — R451 Restlessness and agitation: Secondary | ICD-10-CM | POA: Insufficient documentation

## 2017-05-25 DIAGNOSIS — I10 Essential (primary) hypertension: Secondary | ICD-10-CM | POA: Diagnosis not present

## 2017-05-25 DIAGNOSIS — F09 Unspecified mental disorder due to known physiological condition: Secondary | ICD-10-CM

## 2017-05-25 LAB — CBC WITH DIFFERENTIAL/PLATELET
Basophils Absolute: 0 10*3/uL (ref 0.0–0.1)
Basophils Relative: 0 %
EOS PCT: 1 %
Eosinophils Absolute: 0.1 10*3/uL (ref 0.0–0.7)
HCT: 47 % — ABNORMAL HIGH (ref 36.0–46.0)
Hemoglobin: 15.1 g/dL — ABNORMAL HIGH (ref 12.0–15.0)
LYMPHS ABS: 1.5 10*3/uL (ref 0.7–4.0)
Lymphocytes Relative: 21 %
MCH: 29.5 pg (ref 26.0–34.0)
MCHC: 32.1 g/dL (ref 30.0–36.0)
MCV: 91.8 fL (ref 78.0–100.0)
MONOS PCT: 9 %
Monocytes Absolute: 0.6 10*3/uL (ref 0.1–1.0)
Neutro Abs: 4.9 10*3/uL (ref 1.7–7.7)
Neutrophils Relative %: 69 %
PLATELETS: 199 10*3/uL (ref 150–400)
RBC: 5.12 MIL/uL — ABNORMAL HIGH (ref 3.87–5.11)
RDW: 12.7 % (ref 11.5–15.5)
WBC: 7.1 10*3/uL (ref 4.0–10.5)

## 2017-05-25 LAB — BASIC METABOLIC PANEL
Anion gap: 12 (ref 5–15)
BUN: 24 mg/dL — ABNORMAL HIGH (ref 6–20)
CO2: 25 mmol/L (ref 22–32)
CREATININE: 1.24 mg/dL — AB (ref 0.44–1.00)
Calcium: 9.6 mg/dL (ref 8.9–10.3)
Chloride: 102 mmol/L (ref 101–111)
GFR calc Af Amer: 43 mL/min — ABNORMAL LOW (ref >60)
GFR, EST NON AFRICAN AMERICAN: 37 mL/min — AB (ref >60)
Glucose, Bld: 140 mg/dL — ABNORMAL HIGH (ref 65–99)
Potassium: 4.3 mmol/L (ref 3.5–5.1)
SODIUM: 139 mmol/L (ref 135–145)

## 2017-05-25 LAB — ETHANOL

## 2017-05-25 NOTE — ED Notes (Signed)
Pt says that she doesn't feel safe where she lives because the doors stay unlocked.  She says she has had some things missing from her room.  Says the nursing facility is "out to get money."  Pt says she feels like she has lost control, that the facility has "taken over."  Pt says they are taking her life's earnings.  [employer] says an Engineer, maintenanceestate worker is over her finances and patient says they did not discuss anything with her.

## 2017-05-25 NOTE — Consult Note (Signed)
  Spoke with Dr Hyacinth MeekerMiller related to medication suggestions for patient.  States that the patient is still taking Ativan 0.5 mg bid; and taking Seroquel 25 mg at bed time.  Informed Dr. Hyacinth MeekerMiller that patient [facility] 05/09/17 which is when she started the Seroquel 25 mg Q hs prn x1 during day and discontinued the Ativan.  Recommended patient to discontinue Ativan and take Seroquel as ordered by Dr. Vanetta ShawlHisada before any other medication changes.  Nursing home staff can also call Dr. Vanetta ShawlHisada and inform her of patients behavior to see if she wants to make any medication changes.    Jovonni Borquez B. Kashius Dominic, NP
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
## 2017-05-25 NOTE — Discharge Instructions (Signed)
I have spoken with the psychiatry service this evening, they do not feel that Lisa Harper needs to be admitted to a psychiatric facility, they recommend that you stop using Ativan immediately if she is getting this medication it may be causing her increased agitation.  They also recommend that she start getting Seroquel during the daytime as needed for agitation. The psychiatrist has recommended that you call her psychiatrist, Dr. Vanetta ShawlHisada in the morning for further recommendations.

## 2017-05-26 ENCOUNTER — Other Ambulatory Visit: Payer: Self-pay | Admitting: Family Medicine

## 2017-05-26 ENCOUNTER — Telehealth (HOSPITAL_COMMUNITY): Payer: Self-pay | Admitting: Psychiatry

## 2017-05-26 NOTE — Telephone Encounter (Signed)
Reviewed CC'd note of ED visit last night.   Please contact [facility] and remind the staff of the following recommendation we discussed at the last visit.  1. Discontinue Ativan  2. Start quetiapine 25 mg at night. AND she may take additional 25 mg during the day for paranoia, agitation (if not verbally redirectable)  If there are any escalating behavioral issues despite this regimen over the next few days, recommend them to reschedule for earlier appointment.

## 2017-05-26 NOTE — Telephone Encounter (Signed)
Called [facility] spoke with Stay & notified per Dr Vanetta ShawlHisada: note of ED visit last night.   1. Discontinue Ativan  2. Start quetiapine 25 mg at night. AND she may take additional 25 mg during the day for paranoia, agitation (if not verbally redirectable)  If there are any escalating behavioral issues despite this regimen over the next few days, recommend them to reschedule for earlier appointment.
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
## 2017-06-09 ENCOUNTER — Encounter (HOSPITAL_COMMUNITY): Payer: Self-pay | Admitting: Psychiatry

## 2017-06-09 ENCOUNTER — Ambulatory Visit (INDEPENDENT_AMBULATORY_CARE_PROVIDER_SITE_OTHER): Payer: Medicare Other | Admitting: Psychiatry

## 2017-06-09 VITALS — BP 107/63 | HR 83 | Ht 66.0 in | Wt 136.0 lb

## 2017-06-09 DIAGNOSIS — Z818 Family history of other mental and behavioral disorders: Secondary | ICD-10-CM

## 2017-06-09 DIAGNOSIS — F0390 Unspecified dementia without behavioral disturbance: Secondary | ICD-10-CM

## 2017-06-09 DIAGNOSIS — R45850 Homicidal ideations: Secondary | ICD-10-CM | POA: Diagnosis not present

## 2017-06-09 DIAGNOSIS — F0150 Vascular dementia without behavioral disturbance: Secondary | ICD-10-CM

## 2017-06-09 MED ORDER — QUETIAPINE FUMARATE 25 MG PO TABS: ORAL_TABLET | ORAL | 0 refills | Status: AC

## 2017-06-09 NOTE — Patient Instructions (Signed)
1. Continue quetiapine 25 mg at night. She may take additional 25 mg during the day for paranoia, agitation 2. Return to clinic in three months for 30 mins

## 2017-06-10 ENCOUNTER — Emergency Department (HOSPITAL_COMMUNITY)
Admission: EM | Admit: 2017-06-10 | Discharge: 2017-06-20 | Disposition: A | Discharge status: Home / Self Care | Payer: Medicare Other | Attending: Emergency Medicine | Admitting: Emergency Medicine

## 2017-06-10 ENCOUNTER — Encounter (HOSPITAL_COMMUNITY): Payer: Self-pay | Admitting: Emergency Medicine

## 2017-06-10 DIAGNOSIS — R4189 Other symptoms and signs involving cognitive functions and awareness: Secondary | ICD-10-CM

## 2017-06-10 DIAGNOSIS — F911 Conduct disorder, childhood-onset type: Secondary | ICD-10-CM | POA: Diagnosis not present

## 2017-06-10 DIAGNOSIS — I10 Essential (primary) hypertension: Secondary | ICD-10-CM | POA: Diagnosis not present

## 2017-06-10 DIAGNOSIS — I6789 Other cerebrovascular disease: Secondary | ICD-10-CM | POA: Diagnosis not present

## 2017-06-10 DIAGNOSIS — R456 Violent behavior: Secondary | ICD-10-CM | POA: Diagnosis not present

## 2017-06-10 DIAGNOSIS — R404 Transient alteration of awareness: Secondary | ICD-10-CM | POA: Diagnosis not present

## 2017-06-10 DIAGNOSIS — Z111 Encounter for screening for respiratory tuberculosis: Secondary | ICD-10-CM | POA: Diagnosis not present

## 2017-06-10 DIAGNOSIS — R4689 Other symptoms and signs involving appearance and behavior: Secondary | ICD-10-CM

## 2017-06-10 DIAGNOSIS — S0990XA Unspecified injury of head, initial encounter: Secondary | ICD-10-CM | POA: Diagnosis not present

## 2017-06-10 DIAGNOSIS — F0391 Unspecified dementia with behavioral disturbance: Secondary | ICD-10-CM | POA: Insufficient documentation

## 2017-06-10 DIAGNOSIS — F4489 Other dissociative and conversion disorders: Secondary | ICD-10-CM | POA: Diagnosis not present

## 2017-06-10 DIAGNOSIS — F918 Other conduct disorders: Secondary | ICD-10-CM | POA: Diagnosis not present

## 2017-06-10 MED ORDER — HALOPERIDOL LACTATE 5 MG/ML IJ SOLN
5.0000 mg | Freq: Once | INTRAMUSCULAR | Status: AC
  Administered 2017-06-10: 5 mg via INTRAMUSCULAR
  Filled 2017-06-10: qty 1

## 2017-06-10 MED ORDER — LORAZEPAM 0.5 MG PO TABS: 0.5000 mg | ORAL_TABLET | Freq: Three times a day (TID) | ORAL | 0 refills | Status: AC | PRN

## 2017-06-10 MED ORDER — LORAZEPAM 2 MG/ML IJ SOLN
1.0000 mg | Freq: Once | INTRAMUSCULAR | Status: AC
  Administered 2017-06-12: 1 mg via INTRAMUSCULAR
  Filled 2017-06-10 (×2): qty 1

## 2017-06-10 NOTE — BH Assessment (Signed)
[facility] Assessment Progress Note  Pt assessed and case staffed with Nanine MeansJamison Lord, DNP, who recommends that pt be given/prescribed Ativan .5mg  PRN every 8 hours for agitation and be d/c back to her ALF. Jama FlavorsAdvised Chris, RN, of the disposition.   Johny ShockSamantha M. Ladona Ridgelaylor, MS, NCC, LPCA Counselor
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
## 2017-06-10 NOTE — ED Notes (Signed)
Pt stating she needs to get up to go to bathroom; pt assisted to bathroom and back to bed; pt oriented to place and situation
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
## 2017-06-10 NOTE — ED Notes (Signed)
MD Adriana Simasook, Made aware of patients condition. Orders received
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
## 2017-06-10 NOTE — ED Triage Notes (Signed)
Pt brought in [facility] after leaving the facility and trying to get outside.  Pt was aggressive toward staff trying to get her back inside.  Had recent med changes made.  Pt is currently alert and pleasantly cooperative.  Psych visit yesterday.

## 2017-06-10 NOTE — ED Notes (Signed)
Pt is calm and cooperative.  Has made no attempts to leave and restraints were removed on pt arrival to ED.

## 2017-06-10 NOTE — Discharge Instructions (Signed)
Behavioral health is recommended Ativan 0.5 mg every 8 hours as needed for agitation.  Prescription written.  Follow-up with your normal caregivers.
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
## 2017-06-10 NOTE — ED Provider Notes (Addendum)
[facility] EMERGENCY DEPARTMENT Provider Note   CSN: [id number] Arrival date & time: 06/10/17  1313     History   Chief Complaint Chief Complaint  Patient presents with  . Psychiatric Evaluation    HPI Duane BostonDorothy Curbow is a 84 y.o. female.  Level 5 caveat for urgent need for intervention.  Patient [facility] resides at [facility].  She apparently wandered away from the facility today.  She was aggressive towards staff when she was apprehended.  She was brought to the emergency department by EMS.  She was given Haldol 5 mg IM via EMS.  She is now pleasant and cooperative.  No prodromal illnesses.      Past Medical History:  Diagnosis Date  . Cataract   . Chronic knee pain   . Essential hypertension    not currently on medicine  . History of cardiac murmur   . Mild cognitive impairment   . Pre-diabetes   . Renal insufficiency 02/24/2016    Patient Active Problem List   Diagnosis Date Noted  . Behavior disturbance 03/10/2017  . Major neurocognitive disorder 10/22/2016  . Chronic constipation 08/17/2016  . Immunization refused 02/24/2016  . Renal insufficiency 02/24/2016  . Pseudophakia of both eyes 02/24/2016  . Seasonal allergies 02/24/2016    Past Surgical History:  Procedure Laterality Date  . ABDOMINAL HYSTERECTOMY     for pain  . CATARACT EXTRACTION      OB History    Gravida Para Term Preterm AB Living             0   SAB TAB Ectopic Multiple Live Births                   Home Medications    Prior to Admission medications   Medication Sig Start Date End Date Taking? Authorizing Provider  carboxymethylcellulose (REFRESH PLUS) 0.5 % SOLN Place 1 drop into both eyes 3 (three) times daily as needed.   Yes [provider]  QUEtiapine (SEROQUEL) 25 MG tablet 25 mg at night. She may take additional 25 mg as needed for agitation, paranoia 06/09/17  Yes Hisada, Barbee Cougheina, MD  senna (SENOKOT) 8.6 MG TABS tablet TAKE 1 TABLET BY MOUTH ONCE DAILY  AS NEEDED FOR CONSTIPATION. 11/23/16  Yes Eustace MooreNelson, Yvonne Sue, MD  LORazepam (ATIVAN) 0.5 MG tablet Take 1 tablet (0.5 mg total) by mouth every 8 (eight) hours as needed for anxiety. 06/10/17   Donnetta Hutchingook, Linnie Delgrande, MD    Family History Family History  Problem Relation Age of Onset  . Suicidality Father     Social History Social History   Tobacco Use  . Smoking status: Never Smoker  . Smokeless tobacco: Never Used  Substance Use Topics  . Alcohol use: No  . Drug use: No     Allergies   Patient has no known allergies.   Review of Systems Review of Systems  Unable to perform ROS: Acuity of condition     Physical Exam Updated Vital Signs BP 117/64 (BP Location: Right Arm)   Pulse 100   Temp 99.4 F (37.4 C) (Oral)   Resp 20   Ht 5\' 3"  (1.6 m)   Wt 59 kg (130 lb)   SpO2 97%   BMI 23.03 kg/m   Physical Exam  Constitutional: She appears well-developed and well-nourished.  HENT:  Head: Normocephalic and atraumatic.  Eyes: Conjunctivae are normal.  Neck: Neck supple.  Cardiovascular: Normal rate and regular rhythm.  Pulmonary/Chest: Effort normal and breath  sounds normal.  Abdominal: Soft. Bowel sounds are normal.  Musculoskeletal: Normal range of motion.  Neurological: She is alert.  Skin: Skin is warm and dry.  Psychiatric:  Agitated  Nursing note and vitals reviewed.    ED Treatments / Results  Labs (all labs ordered are listed, but only abnormal results are displayed) Labs Reviewed - No data to display  EKG  EKG Interpretation None       Radiology No results found.  Procedures Procedures (including critical care time)  Medications Ordered in ED Medications - No data to display   Initial Impression / Assessment and Plan / ED Course  I have reviewed the triage vital signs and the nursing notes.  Pertinent labs & imaging results that were available during my care of the patient were reviewed by me and considered in my medical decision making (see  chart for details).     Patient has known mild cognitive impairment.  She apparently wandered away from her care facility today.  She responded well to Haldol 5 mg IM.  Behavioral health consultation was obtained.  Consultation recommended Ativan 0.5 mg every 8 hours PRN agitation.    1800: Patient's rest home will not take her back.  Behavioral health was reconsulted for placement issues.  2100: Patient has been getting out of her bed and wandering into other patient's rooms.  I am concerned about her safety.  I ordered restraints.   CRITICAL CARE Performed by: Donnetta Hutching Total critical care time: 30 minutes Critical care time was exclusive of separately billable procedures and treating other patients. Critical care was necessary to treat or prevent imminent or life-threatening deterioration. Critical care was time spent personally by me on the following activities: development of treatment plan with patient and/or surrogate as well as nursing, discussions with consultants, evaluation of patient's response to treatment, examination of patient, obtaining history from patient or surrogate, ordering and performing treatments and interventions, ordering and review of laboratory studies, ordering and review of radiographic studies, pulse oximetry and re-evaluation of patient's condition.  Final Clinical Impressions(s) / ED Diagnoses   Final diagnoses:  Behavioral change    ED Discharge Orders        Ordered    LORazepam (ATIVAN) 0.5 MG tablet  Every 8 hours PRN     06/10/17 1614       Donnetta Hutching, MD 06/10/17 1622    Donnetta Hutching, MD 06/10/17 2141

## 2017-06-11 LAB — COMPREHENSIVE METABOLIC PANEL
ALT: 17 U/L (ref 14–54)
AST: 27 U/L (ref 15–41)
Albumin: 3.8 g/dL (ref 3.5–5.0)
Alkaline Phosphatase: 66 U/L (ref 38–126)
Anion gap: 11 (ref 5–15)
BUN: 23 mg/dL — ABNORMAL HIGH (ref 6–20)
CO2: 25 mmol/L (ref 22–32)
CREATININE: 1.25 mg/dL — AB (ref 0.44–1.00)
Calcium: 9.5 mg/dL (ref 8.9–10.3)
Chloride: 104 mmol/L (ref 101–111)
GFR calc non Af Amer: 37 mL/min — ABNORMAL LOW (ref >60)
GFR, EST AFRICAN AMERICAN: 43 mL/min — AB (ref >60)
Glucose, Bld: 124 mg/dL — ABNORMAL HIGH (ref 65–99)
POTASSIUM: 4.1 mmol/L (ref 3.5–5.1)
SODIUM: 140 mmol/L (ref 135–145)
Total Bilirubin: 0.9 mg/dL (ref 0.3–1.2)
Total Protein: 7.2 g/dL (ref 6.5–8.1)

## 2017-06-11 LAB — CBC WITH DIFFERENTIAL/PLATELET
Basophils Absolute: 0 10*3/uL (ref 0.0–0.1)
Basophils Relative: 0 %
EOS ABS: 0.1 10*3/uL (ref 0.0–0.7)
Eosinophils Relative: 1 %
HEMATOCRIT: 43.1 % (ref 36.0–46.0)
HEMOGLOBIN: 14 g/dL (ref 12.0–15.0)
LYMPHS ABS: 1.6 10*3/uL (ref 0.7–4.0)
Lymphocytes Relative: 29 %
MCH: 29.7 pg (ref 26.0–34.0)
MCHC: 32.5 g/dL (ref 30.0–36.0)
MCV: 91.3 fL (ref 78.0–100.0)
MONOS PCT: 9 %
Monocytes Absolute: 0.5 10*3/uL (ref 0.1–1.0)
NEUTROS ABS: 3.4 10*3/uL (ref 1.7–7.7)
NEUTROS PCT: 61 %
Platelets: 194 10*3/uL (ref 150–400)
RBC: 4.72 MIL/uL (ref 3.87–5.11)
RDW: 13.2 % (ref 11.5–15.5)
WBC: 5.6 10*3/uL (ref 4.0–10.5)

## 2017-06-11 LAB — RAPID URINE DRUG SCREEN, HOSP PERFORMED
AMPHETAMINES: NOT DETECTED
BARBITURATES: NOT DETECTED
Benzodiazepines: NOT DETECTED
Cocaine: NOT DETECTED
Opiates: NOT DETECTED
TETRAHYDROCANNABINOL: NOT DETECTED

## 2017-06-11 LAB — URINALYSIS, ROUTINE W REFLEX MICROSCOPIC
BILIRUBIN URINE: NEGATIVE
Glucose, UA: NEGATIVE mg/dL
HGB URINE DIPSTICK: NEGATIVE
Ketones, ur: 5 mg/dL — AB
Leukocytes, UA: NEGATIVE
NITRITE: NEGATIVE
Protein, ur: NEGATIVE mg/dL
SPECIFIC GRAVITY, URINE: 1.019 (ref 1.005–1.030)
pH: 5 (ref 5.0–8.0)

## 2017-06-11 LAB — ETHANOL

## 2017-06-11 MED ORDER — LORAZEPAM 1 MG PO TABS
1.0000 mg | ORAL_TABLET | Freq: Two times a day (BID) | ORAL | Status: DC | PRN
  Administered 2017-06-13 – 2017-06-19 (×5): 1 mg via ORAL
  Filled 2017-06-11 (×8): qty 1

## 2017-06-11 MED ORDER — LORAZEPAM 1 MG PO TABS: 0.5000 mg | ORAL_TABLET | Freq: Three times a day (TID) | ORAL | 0 refills | Status: AC | PRN

## 2017-06-11 MED ORDER — POLYETHYLENE GLYCOL 3350 17 G PO PACK
PACK | ORAL | Status: AC
  Administered 2017-06-11: 17 g
  Filled 2017-06-11: qty 1

## 2017-06-11 NOTE — ED Notes (Signed)
Pt refusing to have anything done, states she does not want to be touched

## 2017-06-11 NOTE — ED Notes (Addendum)
Per dr Bebe Shaggywickline, will attempt to get pt to get labs drawn and cont SW when pt awakes. Pt sleepign at present

## 2017-06-11 NOTE — ED Provider Notes (Signed)
Patient has been refusing all labs at this time.  Her vitals been appropriate.  Apparently psychiatry and social worker both been involved in her placement.  No acute issues at this time   Lisa Harper, Lisa Wachs, MD 06/11/17 [phone]

## 2017-06-11 NOTE — ED Notes (Signed)
Pt has not ate meal tray at this time. Pt is sleeping at this time.

## 2017-06-11 NOTE — ED Notes (Signed)
Pt becoming more agitated. Pt states "she wants to go home and leave this place". Pt has been trying to leave room.

## 2017-06-11 NOTE — Progress Notes (Signed)
CSW spoke with AD Zack concerning discharge for pt.  CSW spoke with Morey Hummingbirdassandra Massenburg, guardian of pt.  Pt's guardian stated " she will have to give full disclosure to other potential placements for pt and this process may take longer for new placement.  RN Verlon AuLeslie notified on updated information.  Week day CSW will follow pt.  Budd Palmerara Amaurie Schreckengost LCSWA [phone]

## 2017-06-11 NOTE — ED Notes (Signed)
Pt up to the bathroom and back to bed °

## 2017-06-11 NOTE — ED Provider Notes (Signed)
PRN oral ativan, 1 mg q 12 hours ordered.  Vitals:   06/10/17 2220 06/11/17 1030  BP: (!) 153/66 (!) 150/63  Pulse: 72 81  Resp: 18 18  Temp: 98.5 F (36.9 C) 98.3 F (36.8 C)  SpO2: 96% 99%    Pt is comfortable at this time.   Derwood KaplanNanavati, Cai Anfinson, MD 06/11/17 [phone]

## 2017-06-11 NOTE — ED Notes (Signed)
Pt assisted back in the bed

## 2017-06-11 NOTE — ED Provider Notes (Addendum)
84 y.o. Female sent from [facility] assisted [facility] facility wit reports that she was agitated and confused.  She reportedly refused all interventions last night and was given haldol.  She slept throught the night and is now awake. Labs are being drawn and vitals obtained. Patient awake and alert and appears at baseline.  Reviewed prior notes and telepsych note.  Patint is cleared for d/c.    WDWN female VSS Heent normal Neck supple Lungs cta cv- rrr Abdomen- soft and nttp Back - normal, soft nttp Extremities- normal full arom  Margarita Grizzleay, Aireanna Luellen, MD 06/11/17 1504    Margarita Grizzleay, Latavious Bitter, MD 06/11/17 1505

## 2017-06-11 NOTE — ED Notes (Signed)
Pt ate meal tray and is now lying down in bed sleeping. Warm blanket applied.

## 2017-06-11 NOTE — ED Notes (Signed)
Pt sleeping.  Chest rise and fall noted.

## 2017-06-11 NOTE — ED Notes (Signed)
Director of [facility], [place], reports pt can't return to her facility because she is " a danger to herself and others" and we were to call pt's legal guardian.  Spoke to [employer] MutualPt's corporate guardian with [employer], Elonda HuskyCassandra, and was told it would be Monday before they could start finding her another place to go.

## 2017-06-11 NOTE — ED Notes (Signed)
PT let staff change her into a gown for EDP to assess her. Avasist at bedside for safety monitoring.

## 2017-06-11 NOTE — ED Notes (Signed)
Pt eating breakfast and Avasist in process for fall risk. Pt states she will let them get blood after she eats.

## 2017-06-11 NOTE — ED Notes (Signed)
Pt's guardian is Elonda HuskyCassandra [phone]

## 2017-06-11 NOTE — ED Notes (Signed)
Spoke with SW Delice Bisonara and was told she contacted patient's guardian and said guardian will start placement process Monday.

## 2017-06-12 NOTE — ED Notes (Signed)
Pt resting, visible chest rise and fall.  Restraints removed and avasys ordered.

## 2017-06-12 NOTE — ED Notes (Signed)
Visible, equal chest rise and fall noted.

## 2017-06-12 NOTE — ED Notes (Signed)
Tele-sitter has called back to confirm she has eyes on the patient at this time. One on one sitter no longer in room. Pt being monitored via Avasist. Pt is calm and sleeping at this time.
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
## 2017-06-13 MED ORDER — QUETIAPINE FUMARATE 25 MG PO TABS
25.0000 mg | ORAL_TABLET | Freq: Every day | ORAL | Status: DC
  Administered 2017-06-16: 25 mg via ORAL
  Filled 2017-06-13 (×2): qty 1

## 2017-06-13 MED ORDER — QUETIAPINE FUMARATE 25 MG PO TABS
25.0000 mg | ORAL_TABLET | Freq: Every day | ORAL | Status: DC | PRN
  Administered 2017-06-13 – 2017-06-17 (×2): 25 mg via ORAL
  Filled 2017-06-13 (×4): qty 1

## 2017-06-13 NOTE — ED Notes (Signed)
Pt sleeping.  Even rise and fall of chest noted.  Not disturbing at this time.

## 2017-06-13 NOTE — NC FL2 (Signed)
[place] MEDICAID FL2 LEVEL OF CARE SCREENING TOOL     IDENTIFICATION  Patient Name: Lisa Harper Birthdate: 06/23/1925 Sex: female Admission Date (Current Location): 06/10/2017  [facility] [place]   Facility and Address:  [facility],  [address], [place] [id number]      Provider Number: [id number]  Attending Physician Name and Address:  Default, Provider, MD  Relative Name and Phone Number:  Tomie ChinaCassandra Messenberg [phone] (professional legal guardian)    Current Level of Care: Other (Comment)(Pt currently in the Emergency Department) Recommended Level of Care: Memory Care Prior Approval Number:    Date Approved/Denied:   PASRR Number:    Discharge Plan: Other (Comment)(Memory Care Facility)    Current Diagnoses: Patient Active Problem List   Diagnosis Date Noted  . Behavior disturbance 03/10/2017  . Major neurocognitive disorder 10/22/2016  . Chronic constipation 08/17/2016  . Immunization refused 02/24/2016  . Renal insufficiency 02/24/2016  . Pseudophakia of both eyes 02/24/2016  . Seasonal allergies 02/24/2016    Orientation RESPIRATION BLADDER Height & Weight     Self  Normal Continent Weight: 130 lb (59 kg) Height:  5\' 3"  (160 cm)  BEHAVIORAL SYMPTOMS/MOOD NEUROLOGICAL BOWEL NUTRITION STATUS  (patient can be combative)   Continent    AMBULATORY STATUS COMMUNICATION OF NEEDS Skin   Supervision Verbally Normal                       Personal Care Assistance Level of Assistance  Bathing, Feeding, Dressing Bathing Assistance: Limited assistance Feeding assistance: Independent Dressing Assistance: Limited assistance     Functional Limitations Info  Sight, Hearing, Speech Sight Info: Adequate Hearing Info: Adequate Speech Info: Adequate    SPECIAL CARE FACTORS FREQUENCY                       Contractures Contractures Info: Not present    Additional Factors Info  Psychotropic      Psychotropic Info: ativan, seroquel         Current Medications (06/13/2017):  This is the current hospital active medication list Current Facility-Administered Medications  Medication Dose Route Frequency Provider Last Rate Last Dose  . LORazepam (ATIVAN) tablet 1 mg  1 mg Oral Q12H PRN Derwood KaplanNanavati, Ankit, MD   1 mg at 06/13/17 1021  . QUEtiapine (SEROQUEL) tablet 25 mg  25 mg Oral QHS Mesner, Jason, MD      . QUEtiapine (SEROQUEL) tablet 25 mg  25 mg Oral Daily PRN Mesner, Barbara CowerJason, MD       Current Outpatient Medications  Medication Sig Dispense Refill  . carboxymethylcellulose (REFRESH PLUS) 0.5 % SOLN Place 1 drop into both eyes 3 (three) times daily as needed.    Marland Kitchen. QUEtiapine (SEROQUEL) 25 MG tablet 25 mg at night. She may take additional 25 mg as needed for agitation, paranoia 180 tablet 0  . senna (SENOKOT) 8.6 MG TABS tablet TAKE 1 TABLET BY MOUTH ONCE DAILY AS NEEDED FOR CONSTIPATION. 30 tablet 0  . LORazepam (ATIVAN) 0.5 MG tablet Take 1 tablet (0.5 mg total) by mouth every 8 (eight) hours as needed for anxiety. 30 tablet 0  . LORazepam (ATIVAN) 1 MG tablet Take 0.5 tablets (0.5 mg total) by mouth 3 (three) times daily as needed for anxiety. 15 tablet 0     Discharge Medications: Please see discharge summary for a list of discharge medications.  Relevant Imaging Results:  Relevant Lab Results:   Additional Information  Shade Flood, LCSW

## 2017-06-13 NOTE — ED Notes (Signed)
Patient becoming more agitated. Sitters at bedside. Patient trying to get out of bed.

## 2017-06-13 NOTE — ED Notes (Signed)
Pt assisted to wash face.  Pt is very drowsy this morning, but cooperative.

## 2017-06-13 NOTE — ED Notes (Signed)
Pt ambulatory to bathroom with assist.  Brushing teeth currently.

## 2017-06-13 NOTE — ED Notes (Signed)
Sitter arrived for safety monitoring. Patient awake and moving around. Tele sitter advised of new sitter.

## 2017-06-13 NOTE — Clinical Social Work Note (Signed)
LCSW spoke with Kennyth ArnoldStacy at [facility] [phone]) to update on pt's status and request assistance with placement. Stacy requests FL2 be faxed to her at [phone]. She also requests LCSW start RSVP for ALF placement. Per Kennyth ArnoldStacy, she will start working on new placement for pt as soon as she receives the fax.   Spoke with Dr. Clayborne DanaMesner in ED to update. Will fax FL2 as soon as it is co-signed and will follow.

## 2017-06-13 NOTE — ED Notes (Signed)
Attempted to get pt to take Seroquel, refused this one at this time.  Initial Ativan pulled wasted as pt threw it on the floor.

## 2017-06-13 NOTE — ED Notes (Signed)
Pt looking through telephone book trying to find number to [facility] to contact them.  Attempting to get pt to take meds without success.

## 2017-06-13 NOTE — ED Notes (Signed)
Visitor in to see patient.

## 2017-06-14 ENCOUNTER — Emergency Department (HOSPITAL_COMMUNITY): Payer: Medicare Other

## 2017-06-14 DIAGNOSIS — S0990XA Unspecified injury of head, initial encounter: Secondary | ICD-10-CM | POA: Diagnosis not present

## 2017-06-14 MED ORDER — HALOPERIDOL LACTATE 5 MG/ML IJ SOLN
5.0000 mg | Freq: Once | INTRAMUSCULAR | Status: AC
  Administered 2017-06-14: 5 mg via INTRAMUSCULAR
  Filled 2017-06-14: qty 1

## 2017-06-14 NOTE — Clinical Social Work Note (Signed)
SW following. Attempting to reach pt's guardian to discuss pt's release from the ED. Message left on guardian's direct line [phone] and on the crisis line [phone]). Awaiting return call. Will follow and update once more information is available.

## 2017-06-14 NOTE — ED Notes (Signed)
Nurse heard loud sound and found pt on floor. Pt had crawled over siderails and used basket on wall to pull herself over rails. Pt bed linen wet Pt sitting on right side of buttocks. Pts sitter did no see pt climbing out of bed Pt alert ,but disoriented, which is her norm. Assessed for injury. No obvious injury seen. Pt Assisted to standing position and assisted back to bed. No s/o pain. Dr Erin HearingMessner notified

## 2017-06-14 NOTE — ED Notes (Signed)
Attempted to give pt PRN Seroquel, pt refused; pt kicking at sitter and staff, security called at this time

## 2017-06-14 NOTE — ED Notes (Signed)
Pt attempting to get OOB and becoming agitated with sitter

## 2017-06-14 NOTE — ED Provider Notes (Signed)
Patient is restless and agitated.  Will Rx Haldol 5 mg IM.   Donnetta Hutchingook, Jared Whorley, MD 06/14/17 2026

## 2017-06-14 NOTE — ED Notes (Addendum)
Per sitter, pt becoming increasingly agitated and trying to climb out of bed, unable to redirect

## 2017-06-14 NOTE — ED Provider Notes (Addendum)
Patient is currently being held for social reasons to await placement.  Apparently patient fell out of the bed.  She was immediately alert and disoriented per her baseline.  I evaluated her personally 20-25 minutes later she was sleepy but woke easily.  She was alert and denied any pain.  Full musculoskeletal palpation did not reveal any tenderness or obvious injuries to her face scalp neck or bilateral upper and lower extremities.  We will continue to observe and if she has any pain will image at that time, however at this time no imaging is indicated.    Holliday Sheaffer, Barbara CowerJason, MD 06/14/17 1316  Just informed by nursing that facilities want head CT to rule out stroke. Will add on.    Marily MemosMesner, Jakiya Bookbinder, MD 06/14/17 1318

## 2017-06-15 MED ORDER — TUBERCULIN PPD 5 UNIT/0.1ML ID SOLN
5.0000 [IU] | Freq: Once | INTRADERMAL | Status: AC
  Administered 2017-06-15: 5 [IU] via INTRADERMAL
  Filled 2017-06-15: qty 0.1

## 2017-06-15 NOTE — ED Provider Notes (Signed)
Potential accepting SNF requesting TB test, so one was placed.  It will need to be read on 2/22.  Pt remains stable.   Jacalyn LefevreHaviland, Suhaan Perleberg, MD 06/15/17 1423

## 2017-06-15 NOTE — Clinical Social Work Note (Addendum)
LCSW following. Spoke with [employer] InstrumentsStacy with [employer] [phone]). They have found a facility to accept pt. The [facility] [place] [phone] Spoke with Suzette BattiestVeronica [phone]) at [facility] today. They will accept pt but they need a negative TB skin test result before they can accept her. Spoke with ED MD and she will order it. If it is negative in 48 hours, [facility]. Pt will need ambulance transport at dc due to her dementia behaviors.  Suzette BattiestVeronica asks for some additional clinical information to be faxed to her at [phone]. Will fax this information.   Did call back and speak with Suzette BattiestVeronica again about pt's previous TB test which was in April 2018. Per Suzette BattiestVeronica, she will check with [facility] for [facility]'s previous test results and she will confirm if this would be acceptable since pt is going facility to facility with just the ED days in between.  If those TB test results are acceptable, pt could transfer there today. Will update ED staff as soon as response is received.

## 2017-06-15 NOTE — ED Notes (Signed)
Pt ambulated to bathroom with x's 2 assistance to bathroom

## 2017-06-16 ENCOUNTER — Emergency Department (HOSPITAL_COMMUNITY): Payer: Medicare Other

## 2017-06-16 DIAGNOSIS — Z111 Encounter for screening for respiratory tuberculosis: Secondary | ICD-10-CM | POA: Diagnosis not present

## 2017-06-16 NOTE — ED Notes (Signed)
Pt did not want to eat yet. States she is not hungry pt resting.

## 2017-06-16 NOTE — Clinical Social Work Note (Addendum)
CSW following. Spoke with Suzette BattiestVeronica at [facility] yesterday and she stated that they cannot accept pt until her behaviors are managed and her TB test is read as negative.  Discussed with pt's guardian, Kennyth ArnoldStacy, who inquired if LCSW could assist with referrals to inpatient geriatric psychiatry facility either with [facility] or [facility]. [facility] location does not have bed availability. Spoke with Efraim KaufmannMelissa [phone]) at [facility] location this AM. Faxed requested clinical. Updated guardian and asked her to fax the [facility] and to also call Melissa to discuss pt referral.  Will follow up once feedback is received.   12:11  Spoke with pt's ED RN who states pt has not had negative behavior since 2/19. She has been getting Seroquel at night and has been sleeping. Pt has not needed her PRN ativan for over 24 hours per RN. Called Veronica at the ALF to update. Per Suzette BattiestVeronica, they cannot accept a chest xray as their TB rule-out and they need the skin test results. Pt's results can be read after 12:55 tomorrow. Per Suzette BattiestVeronica, they can take pt tomorrow if no new negative behaviors between now and then and also once her skin test result is read as negative. Pt will need EMS transport.   Have not received any feedback [facility]. Pt's guardian is still interested in pt admitting to that facility if they will accept her because pt's change in behavior is very sudden and not typical for her.  If pt discharges to the ALF, they will need a new FL2 signed within 24 hours of discharge.    LCSW, Tretha SciaraHeather Settle, will be following on 2/22 and she will further assist with pt's placement needs.

## 2017-06-17 MED ORDER — LORAZEPAM 2 MG/ML IJ SOLN
1.0000 mg | Freq: Once | INTRAMUSCULAR | Status: AC
  Administered 2017-06-17: 1 mg via INTRAMUSCULAR
  Filled 2017-06-17: qty 1

## 2017-06-17 NOTE — NC FL2 (Signed)
[place] MEDICAID FL2 LEVEL OF CARE SCREENING TOOL     IDENTIFICATION  Patient Name: Lisa Harper Birthdate: 07-07-1925 Sex: female Admission Date (Current Location): 06/10/2017  [facility] and [place] Number:  [employer] and Address:  [facility],  [address], [place] [address]      Provider Number: [id number]  Attending Physician Name and Address:  Default, Provider, MD  Relative Name and Phone Number:  Tomie China [phone] (professional legal guardian)    Current Level of Care: Other (Comment)(Pt currently in the Emergency Department) Recommended Level of Care: Assisted Living Facility(needs memory care unit) Prior Approval Number:    Date Approved/Denied:   PASRR Number:    Discharge Plan: Other (Comment)(Memory Care Facility)    Current Diagnoses: Patient Active Problem List   Diagnosis Date Noted  . Behavior disturbance 03/10/2017  . Major neurocognitive disorder 10/22/2016  . Chronic constipation 08/17/2016  . Immunization refused 02/24/2016  . Renal insufficiency 02/24/2016  . Pseudophakia of both eyes 02/24/2016  . Seasonal allergies 02/24/2016    Orientation RESPIRATION BLADDER Height & Weight     Self  Normal Continent Weight: 130 lb (59 kg) Height:  5\' 3"  (160 cm)  BEHAVIORAL SYMPTOMS/MOOD NEUROLOGICAL BOWEL NUTRITION STATUS  (patient can be combative)   Continent    AMBULATORY STATUS COMMUNICATION OF NEEDS Skin   Supervision Verbally Normal                       Personal Care Assistance Level of Assistance  Bathing, Feeding, Dressing Bathing Assistance: Limited assistance Feeding assistance: Independent Dressing Assistance: Limited assistance     Functional Limitations Info  Sight, Hearing, Speech Sight Info: Adequate Hearing Info: Adequate Speech Info: Adequate    SPECIAL CARE FACTORS FREQUENCY                       Contractures Contractures Info: Not present    Additional  Factors Info  Psychotropic     Psychotropic Info: ativan, seroquel         Current Medications (06/17/2017):  This is the current hospital active medication list Current Facility-Administered Medications  Medication Dose Route Frequency Provider Last Rate Last Dose  . LORazepam (ATIVAN) tablet 1 mg  1 mg Oral Q12H PRN Derwood Kaplan, MD   1 mg at 06/16/17 2126  . QUEtiapine (SEROQUEL) tablet 25 mg  25 mg Oral QHS Mesner, Jason, MD   25 mg at 06/16/17 2125  . QUEtiapine (SEROQUEL) tablet 25 mg  25 mg Oral Daily PRN Mesner, Barbara Cower, MD   25 mg at 06/13/17 1329   Current Outpatient Medications  Medication Sig Dispense Refill  . carboxymethylcellulose (REFRESH PLUS) 0.5 % SOLN Place 1 drop into both eyes 3 (three) times daily as needed.    Marland Kitchen QUEtiapine (SEROQUEL) 25 MG tablet 25 mg at night. She may take additional 25 mg as needed for agitation, paranoia 180 tablet 0  . senna (SENOKOT) 8.6 MG TABS tablet TAKE 1 TABLET BY MOUTH ONCE DAILY AS NEEDED FOR CONSTIPATION. 30 tablet 0  . LORazepam (ATIVAN) 0.5 MG tablet Take 1 tablet (0.5 mg total) by mouth every 8 (eight) hours as needed for anxiety. 30 tablet 0  . LORazepam (ATIVAN) 1 MG tablet Take 0.5 tablets (0.5 mg total) by mouth 3 (three) times daily as needed for anxiety. 15 tablet 0     Discharge Medications: Current Facility-Administered Medications  Medication Dose Route Frequency Provider Last Rate Last  Dose  . LORazepam (ATIVAN) tablet 1 mg  1 mg Oral Q12H PRN Derwood KaplanNanavati, Ankit, MD   1 mg at 06/16/17 2126  . QUEtiapine (SEROQUEL) tablet 25 mg  25 mg Oral QHS Mesner, Jason, MD   25 mg at 06/16/17 2125  . QUEtiapine (SEROQUEL) tablet 25 mg  25 mg Oral Daily PRN Mesner, Barbara CowerJason, MD   25 mg at 06/13/17 1329   Current Outpatient Medications  Medication Sig Dispense Refill  . carboxymethylcellulose (REFRESH PLUS) 0.5 % SOLN Place 1 drop into both eyes 3 (three) times daily as needed.    Marland Kitchen. QUEtiapine (SEROQUEL) 25 MG tablet 25 mg at night.  She may take additional 25 mg as needed for agitation, paranoia 180 tablet 0  . senna (SENOKOT) 8.6 MG TABS tablet TAKE 1 TABLET BY MOUTH ONCE DAILY AS NEEDED FOR CONSTIPATION. 30 tablet 0  . LORazepam (ATIVAN) 0.5 MG tablet Take 1 tablet (0.5 mg total) by mouth every 8 (eight) hours as needed for anxiety. 30 tablet 0  . LORazepam (ATIVAN) 1 MG tablet Take 0.5 tablets (0.5 mg total) by mouth 3 (three) times daily as needed for anxiety. 15 tablet 0    Relevant Imaging Results:  Relevant Lab Results:   Additional Information    Lacreasha Hinds, Juleen ChinaHeather D, LCSW

## 2017-06-17 NOTE — ED Notes (Signed)
TB skin test is negative.

## 2017-06-18 ENCOUNTER — Other Ambulatory Visit: Payer: Self-pay

## 2017-06-18 MED ORDER — LORAZEPAM 2 MG/ML IJ SOLN
1.0000 mg | Freq: Once | INTRAMUSCULAR | Status: AC | PRN
Start: 2017-06-18 — End: 2017-06-18
  Administered 2017-06-18: 1 mg via INTRAMUSCULAR
  Filled 2017-06-18: qty 1

## 2017-06-18 MED ORDER — DIPHENHYDRAMINE HCL 50 MG/ML IJ SOLN
25.0000 mg | Freq: Once | INTRAMUSCULAR | Status: AC
  Administered 2017-06-18: 25 mg via INTRAMUSCULAR
  Filled 2017-06-18: qty 1

## 2017-06-18 NOTE — ED Notes (Signed)
After trying to have two nurses give patient bedtime po medications, patient became agitated and try to ambulate out of room unassisted.  Patient being kicking and trying to bite staff as we redirected her, patient given IM Ativan for agitation, sitter and security at bedside.

## 2017-06-18 NOTE — ED Notes (Signed)
Patient agitated in bed and refusing to stay dressed. Patient refuses to take oral medication.

## 2017-06-19 NOTE — ED Notes (Signed)
Pt remained agitated with staff and keeps trying to leave bed and room. Pt extremely combative and verbally hostile.  Pt continues to refuse po medications. MD notified.

## 2017-06-19 NOTE — ED Notes (Signed)
Patient given dinner tray. Sitter at bedside.

## 2017-06-19 NOTE — ED Notes (Signed)
Patient given lunch tray.

## 2017-06-19 NOTE — ED Notes (Signed)
Pt becoming restless. [facility]

## 2017-06-19 NOTE — ED Notes (Signed)
Patient linen/depend changed. Patient in hospital bed with sitter at bedside. No complaints at this time. Patient calm.

## 2017-06-20 DIAGNOSIS — Z743 Need for continuous supervision: Secondary | ICD-10-CM | POA: Diagnosis not present

## 2017-06-20 DIAGNOSIS — R4182 Altered mental status, unspecified: Secondary | ICD-10-CM | POA: Diagnosis not present

## 2017-06-20 DIAGNOSIS — R279 Unspecified lack of coordination: Secondary | ICD-10-CM | POA: Diagnosis not present

## 2017-06-20 DIAGNOSIS — G301 Alzheimer's disease with late onset: Secondary | ICD-10-CM | POA: Diagnosis not present

## 2017-06-20 DIAGNOSIS — F0281 Dementia in other diseases classified elsewhere with behavioral disturbance: Secondary | ICD-10-CM | POA: Diagnosis not present

## 2017-06-20 DIAGNOSIS — Z79899 Other long term (current) drug therapy: Secondary | ICD-10-CM | POA: Diagnosis not present

## 2017-06-20 DIAGNOSIS — R6889 Other general symptoms and signs: Secondary | ICD-10-CM | POA: Diagnosis not present

## 2017-06-20 NOTE — ED Notes (Signed)
Attempted report to [phone] and left message. Number given to me by Olegario MessierKathy SW

## 2017-06-20 NOTE — ED Notes (Signed)
Pt given breakfast tray

## 2017-06-20 NOTE — Clinical Social Work Note (Signed)
Spoke with Misty StanleyLisa at [facility] this AM regarding pt's admission there. They can accept pt today. ED needs to send pt's previous home meds with her. These meds were brought to the ED by pt's guardian on Friday. Updated pt's RN. Envelope with requested documentation given to RN in ED. Phone number for report given to RN in ED. [facility] transport arranged for 10:15. There were no other SW needs for dc.

## 2017-06-21 DIAGNOSIS — R4182 Altered mental status, unspecified: Secondary | ICD-10-CM | POA: Diagnosis not present

## 2017-06-21 NOTE — NC FL2 (Deleted)
  [place] MEDICAID FL2 LEVEL OF CARE SCREENING TOOL     IDENTIFICATION  Patient Name: Lisa BostonDorothy Nahm Birthdate: [birth year] Sex: female Admission Date (Current Location): 06/10/2017  [facility] and [place]:  [employer]   Facility and Address:  [facility],  [address], [place] [id number]      Provider Number: [id number]  Attending Physician Name and Address:  No att. providers found  Relative Name and Phone Number:  Tomie ChinaCassandra Messenberg [phone] (professional legal guardian)    Current Level of Care: Other (Comment)(Pt currently in the Emergency Department) Recommended Level of Care: Assisted Living Facility Prior Approval Number:    Date Approved/Denied:   PASRR Number:    Discharge Plan: Other (Comment)(ALF)    Current Diagnoses: Patient Active Problem List   Diagnosis Date Noted  . Behavior disturbance 03/10/2017  . Major neurocognitive disorder 10/22/2016  . Chronic constipation 08/17/2016  . Immunization refused 02/24/2016  . Renal insufficiency 02/24/2016  . Pseudophakia of both eyes 02/24/2016  . Seasonal allergies 02/24/2016    Orientation RESPIRATION BLADDER Height & Weight     Self  Normal Continent Weight: 130 lb (59 kg) Height:  5\' 3"  (160 cm)  BEHAVIORAL SYMPTOMS/MOOD NEUROLOGICAL BOWEL NUTRITION STATUS  (patient can be combative)   Continent    AMBULATORY STATUS COMMUNICATION OF NEEDS Skin   Supervision Verbally Normal                       Personal Care Assistance Level of Assistance  Bathing, Feeding, Dressing Bathing Assistance: Limited assistance Feeding assistance: Limited assistance Dressing Assistance: Limited assistance     Functional Limitations Info  Sight, Hearing, Speech Sight Info: Adequate Hearing Info: Adequate Speech Info: Adequate    SPECIAL CARE FACTORS FREQUENCY                       Contractures Contractures Info: Not present    Additional Factors Info  Psychotropic      Psychotropic Info: ativan, seroquel         Current Medications (06/21/2017):  This is the current hospital active medication list No current facility-administered medications for this encounter.    Current Outpatient Medications  Medication Sig Dispense Refill  . carboxymethylcellulose (REFRESH PLUS) 0.5 % SOLN Place 1 drop into both eyes 3 (three) times daily as needed.    Marland Kitchen. QUEtiapine (SEROQUEL) 25 MG tablet 25 mg at night. She may take additional 25 mg as needed for agitation, paranoia 180 tablet 0  . senna (SENOKOT) 8.6 MG TABS tablet TAKE 1 TABLET BY MOUTH ONCE DAILY AS NEEDED FOR CONSTIPATION. 30 tablet 0  . LORazepam (ATIVAN) 0.5 MG tablet Take 1 tablet (0.5 mg total) by mouth every 8 (eight) hours as needed for anxiety. 30 tablet 0  . LORazepam (ATIVAN) 1 MG tablet Take 0.5 tablets (0.5 mg total) by mouth 3 (three) times daily as needed for anxiety. 15 tablet 0     Discharge Medications: Please see discharge summary for a list of discharge medications.  Relevant Imaging Results:  Relevant Lab Results:   Additional Information    Elliot GaultKathleen Zula Hovsepian, LCSW

## 2017-06-21 NOTE — Clinical Social Work Note (Signed)
[facility] stating that they needed some inaccurate information on pt's FL2 corrected. Discussed with ED physician and new FL2 was completed and faxed to the facility.

## 2017-06-21 NOTE — NC FL2 (Signed)
[place] MEDICAID FL2 LEVEL OF CARE SCREENING TOOL     IDENTIFICATION  Patient Name: Samyia Motter Birthdate: 1926/01/12 Sex: female Admission Date (Current Location): 06/10/2017  [facility] and [place] Number:  [employer] and Address:  [facility],  [address], [place] [address]      Provider Number: [id number]  Attending Physician Name and Address:  No att. providers found  Relative Name and Phone Number:  Tomie China [phone] (professional legal guardian)    Current Level of Care: Other (Comment)(Pt currently in the Emergency Department) Recommended Level of Care: Assisted Living Facility Prior Approval Number:    Date Approved/Denied:   PASRR Number:    Discharge Plan: Other (Comment)(ALF)    Current Diagnoses: Patient Active Problem List   Diagnosis Date Noted  . Behavior disturbance 03/10/2017  . Major neurocognitive disorder 10/22/2016  . Chronic constipation 08/17/2016  . Immunization refused 02/24/2016  . Renal insufficiency 02/24/2016  . Pseudophakia of both eyes 02/24/2016  . Seasonal allergies 02/24/2016    Orientation RESPIRATION BLADDER Height & Weight     Self  Normal Continent Weight: 130 lb (59 kg) Height:  5\' 3"  (160 cm)  BEHAVIORAL SYMPTOMS/MOOD NEUROLOGICAL BOWEL NUTRITION STATUS  (patient can be combative)   Continent    AMBULATORY STATUS COMMUNICATION OF NEEDS Skin   Supervision Verbally Normal                       Personal Care Assistance Level of Assistance  Bathing, Feeding, Dressing Bathing Assistance: Limited assistance Feeding assistance: Limited assistance Dressing Assistance: Limited assistance     Functional Limitations Info  Sight, Hearing, Speech Sight Info: Adequate Hearing Info: Adequate Speech Info: Adequate    SPECIAL CARE FACTORS FREQUENCY                       Contractures Contractures Info: Not present    Additional Factors Info  Psychotropic      Psychotropic Info: ativan, seroquel         Current Medications (06/21/2017):  This is the current hospital active medication list No current facility-administered medications for this encounter.    Current Outpatient Medications  Medication Sig Dispense Refill  . carboxymethylcellulose (REFRESH PLUS) 0.5 % SOLN Place 1 drop into both eyes 3 (three) times daily as needed.    Marland Kitchen QUEtiapine (SEROQUEL) 25 MG tablet 25 mg at night. She may take additional 25 mg as needed for agitation, paranoia 180 tablet 0  . senna (SENOKOT) 8.6 MG TABS tablet TAKE 1 TABLET BY MOUTH ONCE DAILY AS NEEDED FOR CONSTIPATION. 30 tablet 0  . LORazepam (ATIVAN) 0.5 MG tablet Take 1 tablet (0.5 mg total) by mouth every 8 (eight) hours as needed for anxiety. 30 tablet 0  . LORazepam (ATIVAN) 1 MG tablet Take 0.5 tablets (0.5 mg total) by mouth 3 (three) times daily as needed for anxiety. 15 tablet 0     Discharge Medications:  Current Facility-Administered Medications  Medication Dose Route Frequency Provider Last Rate Last Dose  . LORazepam (ATIVAN) tablet 1 mg  1 mg Oral Q12H PRN Derwood Kaplan, MD   1 mg at 06/16/17 2126  . QUEtiapine (SEROQUEL) tablet 25 mg  25 mg Oral QHS Mesner, Jason, MD   25 mg at 06/16/17 2125  . QUEtiapine (SEROQUEL) tablet 25 mg  25 mg Oral Daily PRN Mesner, Barbara Cower, MD   25 mg at 06/13/17 1329  Current Outpatient Medications  Medication Sig Dispense Refill  . carboxymethylcellulose (REFRESH PLUS) 0.5 % SOLN Place 1 drop into both eyes 3 (three) times daily as needed.    Marland Kitchen. QUEtiapine (SEROQUEL) 25 MG tablet 25 mg at night. She may take additional 25 mg as needed for agitation, paranoia 180 tablet 0  . senna (SENOKOT) 8.6 MG TABS tablet TAKE 1 TABLET BY MOUTH ONCE DAILY AS NEEDED FOR CONSTIPATION. 30 tablet 0  . LORazepam (ATIVAN) 0.5 MG tablet Take 1 tablet (0.5 mg total) by mouth every 8 (eight) hours as needed for anxiety. 30 tablet 0  . LORazepam (ATIVAN) 1 MG tablet Take  0.5 tablets (0.5 mg total) by mouth 3 (three) times daily as needed for anxiety. 15 tablet       Relevant Imaging Results:  Relevant Lab Results:   Additional Information    Elliot GaultKathleen Agam Tuohy, LCSW

## 2017-06-29 DIAGNOSIS — F419 Anxiety disorder, unspecified: Secondary | ICD-10-CM | POA: Diagnosis not present

## 2017-06-29 DIAGNOSIS — F0281 Dementia in other diseases classified elsewhere with behavioral disturbance: Secondary | ICD-10-CM | POA: Diagnosis not present

## 2017-06-29 DIAGNOSIS — G309 Alzheimer's disease, unspecified: Secondary | ICD-10-CM | POA: Diagnosis not present

## 2017-07-11 DIAGNOSIS — F0391 Unspecified dementia with behavioral disturbance: Secondary | ICD-10-CM | POA: Diagnosis not present

## 2017-07-11 DIAGNOSIS — F419 Anxiety disorder, unspecified: Secondary | ICD-10-CM | POA: Diagnosis not present

## 2017-07-18 DIAGNOSIS — F0391 Unspecified dementia with behavioral disturbance: Secondary | ICD-10-CM | POA: Diagnosis not present

## 2017-07-18 DIAGNOSIS — F419 Anxiety disorder, unspecified: Secondary | ICD-10-CM | POA: Diagnosis not present

## 2017-07-25 DIAGNOSIS — F419 Anxiety disorder, unspecified: Secondary | ICD-10-CM | POA: Diagnosis not present

## 2017-07-25 DIAGNOSIS — F0391 Unspecified dementia with behavioral disturbance: Secondary | ICD-10-CM | POA: Diagnosis not present

## 2017-08-03 DIAGNOSIS — F419 Anxiety disorder, unspecified: Secondary | ICD-10-CM | POA: Diagnosis not present

## 2017-08-03 DIAGNOSIS — G309 Alzheimer's disease, unspecified: Secondary | ICD-10-CM | POA: Diagnosis not present

## 2017-08-08 DIAGNOSIS — F419 Anxiety disorder, unspecified: Secondary | ICD-10-CM | POA: Diagnosis not present

## 2017-08-08 DIAGNOSIS — F0391 Unspecified dementia with behavioral disturbance: Secondary | ICD-10-CM | POA: Diagnosis not present

## 2017-08-17 DIAGNOSIS — F419 Anxiety disorder, unspecified: Secondary | ICD-10-CM | POA: Diagnosis not present

## 2017-08-17 DIAGNOSIS — F0281 Dementia in other diseases classified elsewhere with behavioral disturbance: Secondary | ICD-10-CM | POA: Diagnosis not present

## 2017-08-17 DIAGNOSIS — G309 Alzheimer's disease, unspecified: Secondary | ICD-10-CM | POA: Diagnosis not present

## 2017-08-22 DIAGNOSIS — F0391 Unspecified dementia with behavioral disturbance: Secondary | ICD-10-CM | POA: Diagnosis not present

## 2017-08-22 DIAGNOSIS — F419 Anxiety disorder, unspecified: Secondary | ICD-10-CM | POA: Diagnosis not present

## 2017-08-24 DIAGNOSIS — G309 Alzheimer's disease, unspecified: Secondary | ICD-10-CM | POA: Diagnosis not present

## 2017-08-24 DIAGNOSIS — F419 Anxiety disorder, unspecified: Secondary | ICD-10-CM | POA: Diagnosis not present

## 2017-09-01 NOTE — Progress Notes (Deleted)
BH MD/PA/NP OP Progress Note  09/01/2017 4:04 PM Lisa Harper  MRN:  [id number]  Chief Complaint:  HPI:  - Patient wandered away from home and became aggressive; patient was brought to ED and was sent to ALF.   Per PMP,  Ativan powder filled on 06/15/202019 (dose unknown)   Visit Diagnosis: No diagnosis found.  Past Psychiatric History:  I have reviewed the patient's psychiatry history in detail and updated the patient record. Outpatient:unknown Psychiatry admission:  Previous suicide attempt:  Past trials of medication:ativan History of violence:   Past Medical History:  Past Medical History:  Diagnosis Date  . Cataract   . Chronic knee pain   . Essential hypertension    not currently on medicine  . History of cardiac murmur   . Mild cognitive impairment   . Pre-diabetes   . Renal insufficiency 02/24/2016    Past Surgical History:  Procedure Laterality Date  . ABDOMINAL HYSTERECTOMY     for pain  . CATARACT EXTRACTION      Family Psychiatric History:  I have reviewed the patient's family history in detail and updated the patient record. Family History:  Family History  Problem Relation Age of Onset  . Suicidality Father     Social History:  Social History   Socioeconomic History  . Marital status: Single    Spouse name: Not on file  . Number of children: Not on file  . Years of education: Not on file  . Highest education level: Not on file  Occupational History  . Not on file  Social Needs  . Financial resource strain: Not on file  . Food insecurity:    Worry: Not on file    Inability: Not on file  . Transportation needs:    Medical: Not on file    Non-medical: Not on file  Tobacco Use  . Smoking status: Never Smoker  . Smokeless tobacco: Never Used  Substance and Sexual Activity  . Alcohol use: No  . Drug use: No  . Sexual activity: Not Currently  Lifestyle  . Physical activity:    Days per week: Not on file    Minutes per session: Not  on file  . Stress: Not on file  Relationships  . Social connections:    Talks on phone: Not on file    Gets together: Not on file    Attends religious service: Not on file    Active member of club or organization: Not on file    Attends meetings of clubs or organizations: Not on file    Relationship status: Not on file  Other Topics Concern  . Not on file  Social History Narrative   Single,  Lives alone.  Never married, no children    Allergies: No Known Allergies  Metabolic Disorder Labs: No results found for: HGBA1C, MPG No results found for: PROLACTIN No results found for: CHOL, TRIG, HDL, CHOLHDL, VLDL, LDLCALC Lab Results  Component Value Date   TSH 1.922 01/21/2017    Therapeutic Level Labs: No results found for: LITHIUM No results found for: VALPROATE No components found for:  CBMZ  Current Medications: Current Outpatient Medications  Medication Sig Dispense Refill  . carboxymethylcellulose (REFRESH PLUS) 0.5 % SOLN Place 1 drop into both eyes 3 (three) times daily as needed.    Marland Kitchen LORazepam (ATIVAN) 0.5 MG tablet Take 1 tablet (0.5 mg total) by mouth every 8 (eight) hours as needed for anxiety. 30 tablet 0  . LORazepam (ATIVAN) 1 MG  tablet Take 0.5 tablets (0.5 mg total) by mouth 3 (three) times daily as needed for anxiety. 15 tablet 0  . QUEtiapine (SEROQUEL) 25 MG tablet 25 mg at night. She may take additional 25 mg as needed for agitation, paranoia 180 tablet 0  . senna (SENOKOT) 8.6 MG TABS tablet TAKE 1 TABLET BY MOUTH ONCE DAILY AS NEEDED FOR CONSTIPATION. 30 tablet 0   No current facility-administered medications for this visit.      Musculoskeletal: Strength & Muscle Tone: {desc; muscle tone:32375} Gait & Station: {PE GAIT ED ZOXW:96045} Patient leans: {Patient Leans:21022755}  Psychiatric Specialty Exam: ROS  There were no vitals taken for this visit.There is no height or weight on file to calculate BMI.  General Appearance: {Appearance:22683}   Eye Contact:  {BHH EYE CONTACT:22684}  Speech:  {Speech:22685}  Volume:  {Volume (PAA):22686}  Mood:  {BHH MOOD:22306}  Affect:  {Affect (PAA):22687}  Thought Process:  {Thought Process (PAA):22688}  Orientation:  {BHH ORIENTATION (PAA):22689}  Thought Content: {Thought Content:22690}   Suicidal Thoughts:  {ST/HT (PAA):22692}  Homicidal Thoughts:  {ST/HT (PAA):22692}  Memory:  {BHH MEMORY:22881}  Judgement:  {Judgement (PAA):22694}  Insight:  {Insight (PAA):22695}  Psychomotor Activity:  {Psychomotor (PAA):22696}  Concentration:  {Concentration:21399}  Recall:  {BHH GOOD/FAIR/POOR:22877}  Fund of Knowledge: {BHH GOOD/FAIR/POOR:22877}  Language: {BHH GOOD/FAIR/POOR:22877}  Akathisia:  {BHH YES OR NO:22294}  Handed:  {Handed:22697}  AIMS (if indicated): {Desc; done/not:10129}  Assets:  {Assets (PAA):22698}  ADL's:  {BHH WUJ'W:11914}  Cognition: {chl bhh cognition:304700322}  Sleep:  {BHH GOOD/FAIR/POOR:22877}   Screenings: PHQ2-9     Office Visit from 03/10/2017 in [facility] Office Visit from 10/22/2016 in [facility] Office Visit from 08/17/2016 in [facility] Office Visit from 06/01/2016 in [facility] Office Visit from 02/24/2016 in [facility]  PHQ-2 Total Score  0  0  0  0  0     Head MRI 01/17/2017 FINDINGS: Moderately motion degraded axial T1, severely motion degraded axial blood sensitive sequence.  BRAIN: No reduced diffusion to suggest acute ischemia. No susceptibility artifact to suggest hemorrhage. The ventricles and sulci are normal for patient's age. No suspicious parenchymal signal, mass or mass effect. No abnormal extra-axial fluid collections.  VASCULAR: Normal major intracranial vascular flow voids present at skull base.  IMPRESSION: Negative motion degradednon contrastMRI of the head for age.  Head CT 12/2016 FINDINGS: Brain: No acute territorial infarction, hemorrhage, or  intracranial mass is visualized. Stable ventricle size. Mild to moderate atrophy.   Assessment and Plan:  Lisa Harper is a 84 y.o. year old female with a history of neurocognitive disorder , who presents for follow up appointment for No diagnosis found.  # Major neurocognitive disorder  Exam is notable for disorganization, rumination (about her father's death), although she appears to be more easily redirectable on today's interview. Although she does have episodes of some mild combativeness, she has not had significant behavioral issues since the visit to ED. Will continue quetiapine to target agitation. Discussed with staff and the guardian regarding the risk of drowsiness and increased mortality for people with dementia. Although the clinical course is consistent with Alzheimer's disease, it is difficult to make diagnosis without detailed information of timeline of her symptoms. Will continue to monitor. Will not add Aricept/memantine weighing risk/benefit at this time. Will do MOCA at the next visit for further evaluation.   Plan  1. Continue quetiapine 25 mg at night. She may take additional 25 mg during the day for  paranoia, agitation 2. Return to clinic in three months for 30 mins - Will do MOCA at the next evaluation - Reviewed labs; TSH, folate, Vit B 12 wnl.      Neysa Hotter, MD 09/01/2017, 4:04 PM

## 2017-09-06 ENCOUNTER — Ambulatory Visit (HOSPITAL_COMMUNITY): Payer: Medicare Other | Admitting: Psychiatry

## 2017-09-07 DIAGNOSIS — F419 Anxiety disorder, unspecified: Secondary | ICD-10-CM | POA: Diagnosis not present

## 2017-09-07 DIAGNOSIS — F0281 Dementia in other diseases classified elsewhere with behavioral disturbance: Secondary | ICD-10-CM | POA: Diagnosis not present

## 2017-09-07 DIAGNOSIS — G309 Alzheimer's disease, unspecified: Secondary | ICD-10-CM | POA: Diagnosis not present

## 2017-09-08 DIAGNOSIS — F0391 Unspecified dementia with behavioral disturbance: Secondary | ICD-10-CM | POA: Diagnosis not present

## 2017-09-08 DIAGNOSIS — F419 Anxiety disorder, unspecified: Secondary | ICD-10-CM | POA: Diagnosis not present

## 2017-09-12 DIAGNOSIS — G3189 Other specified degenerative diseases of nervous system: Secondary | ICD-10-CM | POA: Diagnosis not present

## 2017-09-12 DIAGNOSIS — Z961 Presence of intraocular lens: Secondary | ICD-10-CM | POA: Diagnosis not present

## 2017-09-12 DIAGNOSIS — N189 Chronic kidney disease, unspecified: Secondary | ICD-10-CM | POA: Diagnosis not present

## 2017-09-12 DIAGNOSIS — F0391 Unspecified dementia with behavioral disturbance: Secondary | ICD-10-CM | POA: Diagnosis not present

## 2017-09-12 DIAGNOSIS — K5900 Constipation, unspecified: Secondary | ICD-10-CM | POA: Diagnosis not present

## 2017-09-12 DIAGNOSIS — Z2821 Immunization not carried out because of patient refusal: Secondary | ICD-10-CM | POA: Diagnosis not present

## 2017-09-12 DIAGNOSIS — J3089 Other allergic rhinitis: Secondary | ICD-10-CM | POA: Diagnosis not present

## 2017-09-12 DIAGNOSIS — G309 Alzheimer's disease, unspecified: Secondary | ICD-10-CM | POA: Diagnosis not present

## 2017-09-14 DIAGNOSIS — F419 Anxiety disorder, unspecified: Secondary | ICD-10-CM | POA: Diagnosis not present

## 2017-09-14 DIAGNOSIS — G309 Alzheimer's disease, unspecified: Secondary | ICD-10-CM | POA: Diagnosis not present

## 2017-09-14 DIAGNOSIS — F0281 Dementia in other diseases classified elsewhere with behavioral disturbance: Secondary | ICD-10-CM | POA: Diagnosis not present

## 2017-09-16 DIAGNOSIS — F0390 Unspecified dementia without behavioral disturbance: Secondary | ICD-10-CM | POA: Diagnosis not present

## 2017-09-16 DIAGNOSIS — R456 Violent behavior: Secondary | ICD-10-CM | POA: Diagnosis not present

## 2017-09-16 DIAGNOSIS — R454 Irritability and anger: Secondary | ICD-10-CM | POA: Diagnosis not present

## 2017-09-16 DIAGNOSIS — Z79899 Other long term (current) drug therapy: Secondary | ICD-10-CM | POA: Diagnosis not present

## 2017-09-16 DIAGNOSIS — R4182 Altered mental status, unspecified: Secondary | ICD-10-CM | POA: Diagnosis not present

## 2017-09-21 DIAGNOSIS — F419 Anxiety disorder, unspecified: Secondary | ICD-10-CM | POA: Diagnosis not present

## 2017-09-21 DIAGNOSIS — F0281 Dementia in other diseases classified elsewhere with behavioral disturbance: Secondary | ICD-10-CM | POA: Diagnosis not present

## 2017-09-21 DIAGNOSIS — G309 Alzheimer's disease, unspecified: Secondary | ICD-10-CM | POA: Diagnosis not present

## 2017-09-22 DIAGNOSIS — F419 Anxiety disorder, unspecified: Secondary | ICD-10-CM | POA: Diagnosis not present

## 2017-09-22 DIAGNOSIS — F0391 Unspecified dementia with behavioral disturbance: Secondary | ICD-10-CM | POA: Diagnosis not present

## 2017-09-28 DIAGNOSIS — F419 Anxiety disorder, unspecified: Secondary | ICD-10-CM | POA: Diagnosis not present

## 2017-10-07 IMAGING — CT CT HEAD W/O CM
3 series · 15 of 47 positions shown, 18 images · non-contrast
Comparison: MRI a 01/17/2017

CLINICAL DATA: Altered level of consciousness

EXAM:
CT HEAD WITHOUT CONTRAST
TECHNIQUE: Contiguous axial images were obtained from the base of the skull
through the vertex without intravenous contrast.

[Series 2: head wo · axial · 0.44mm/px · z∈[+1399,+1529]mm · 9 of 32 slices shown, 12 images]
[im 3/32  brain]
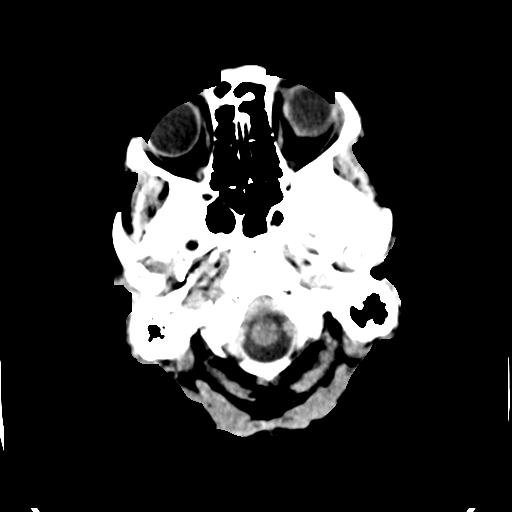
[im 3/32  bone]
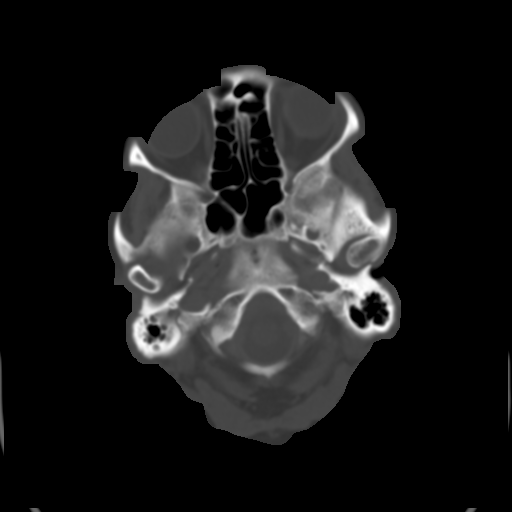
[im 6/32  brain]
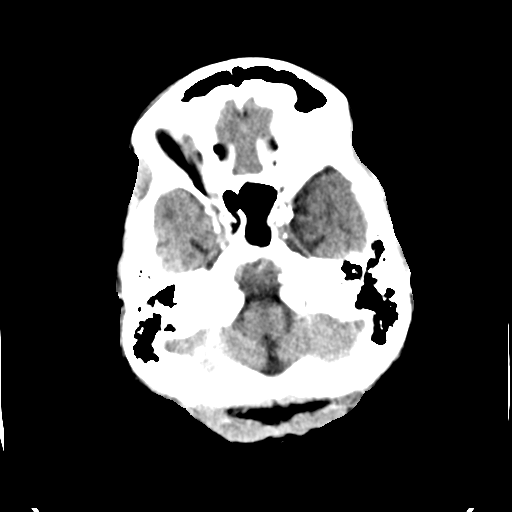
[im 9/32  brain]
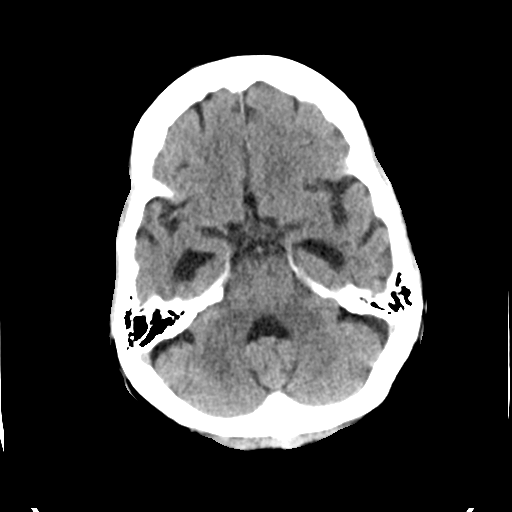
[im 12/32  brain]
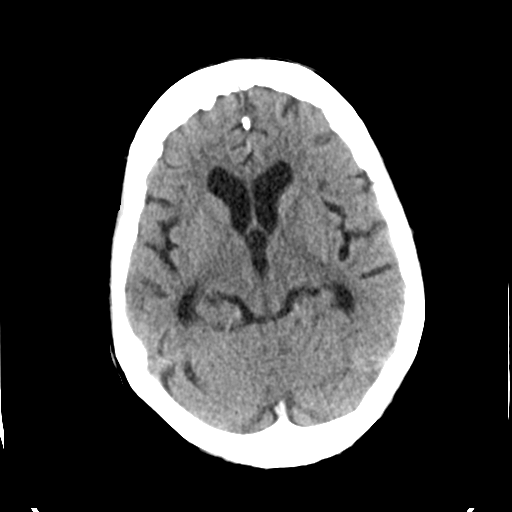
[im 17/32  brain]
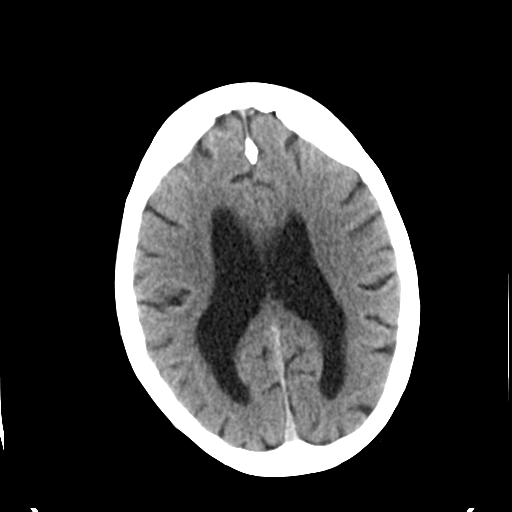
[im 17/32  bone]
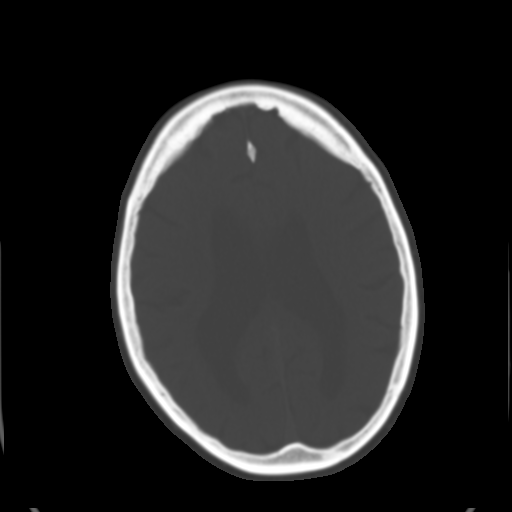
[im 20/32  brain]
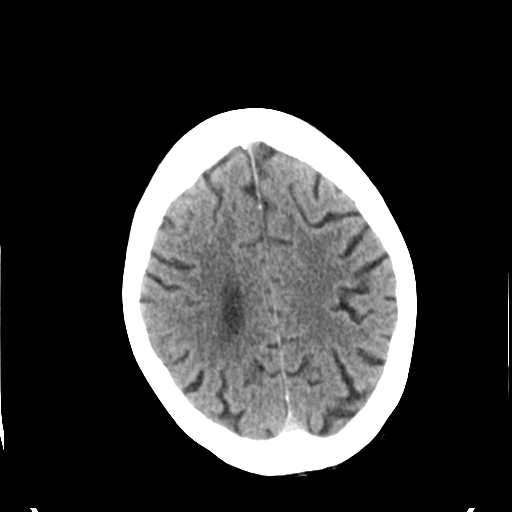
[im 23/32  brain]
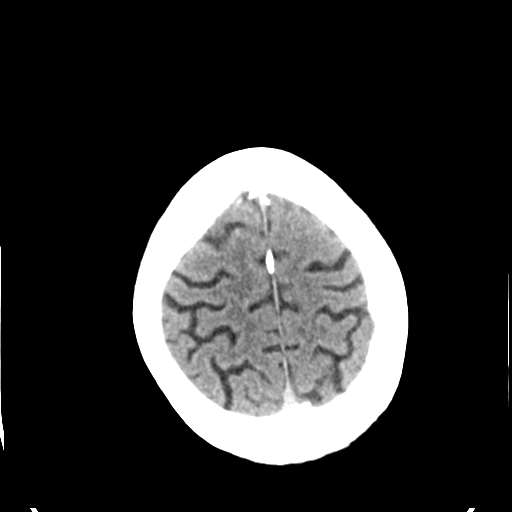
[im 26/32  brain]
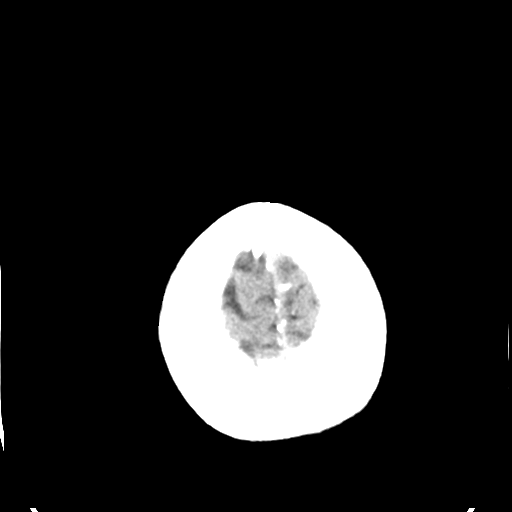
[im 29/32  brain]
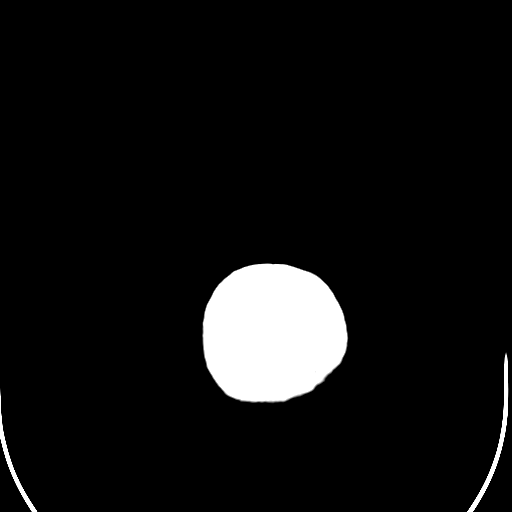
[im 29/32  bone]
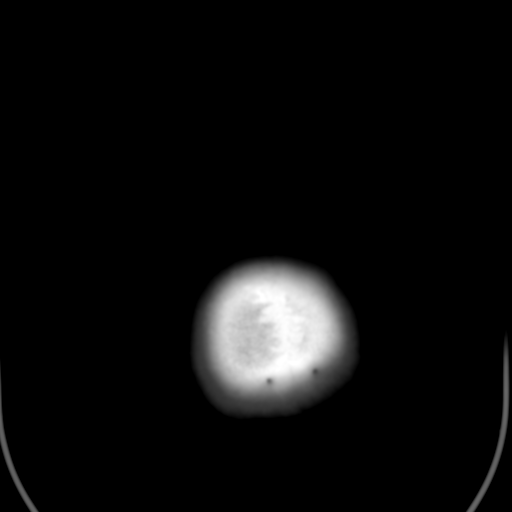

[Series 4: coronal soft tissue · coronal · 0.31mm/px · 3 of 65 slices shown]
[im 22/65  brain]
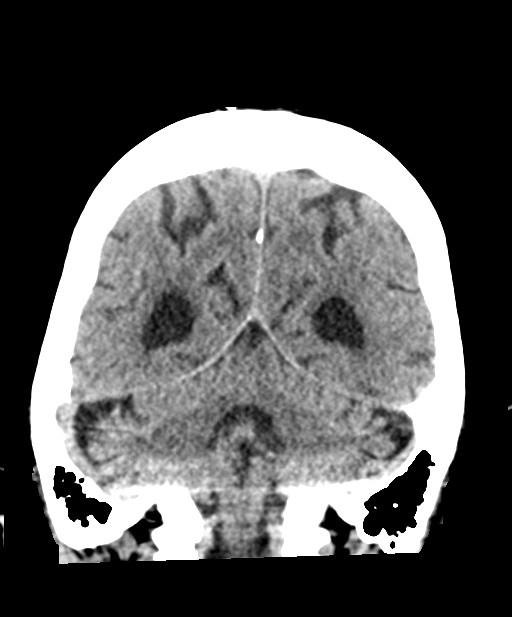
[im 29/65  brain]
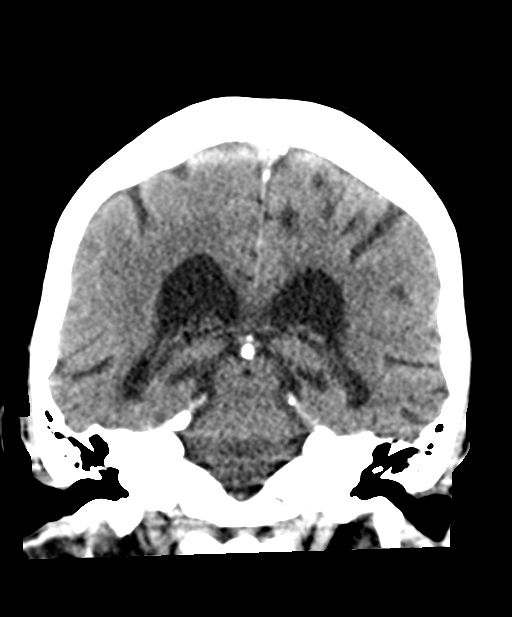
[im 36/65  brain]
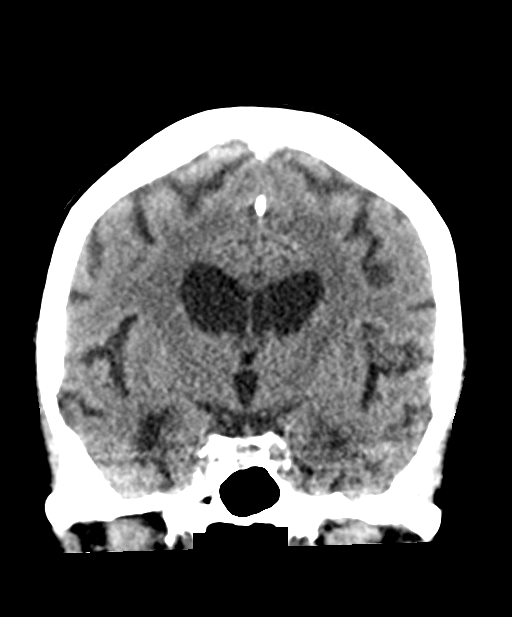

[Series 5: sagittal soft tissue · sagittal · 0.35mm/px · 3 of 52 slices shown]
[im 18/52  brain]
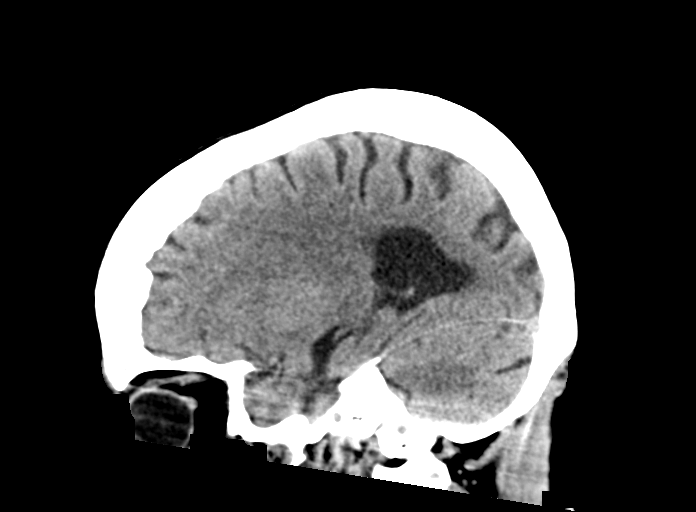
[im 26/52  brain]
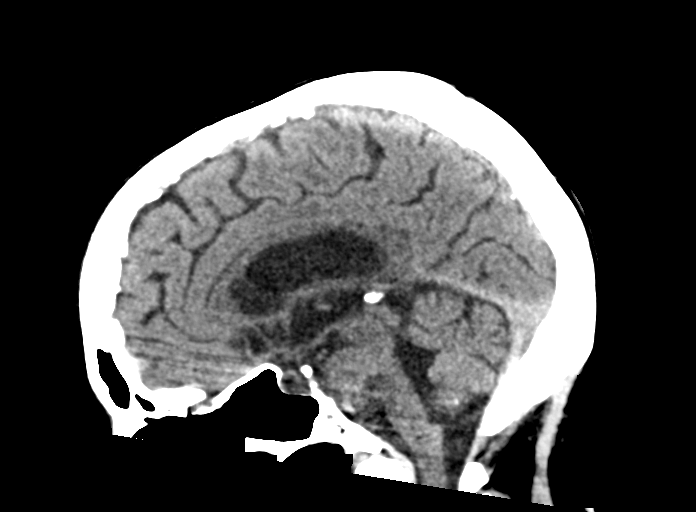
[im 35/52  brain]
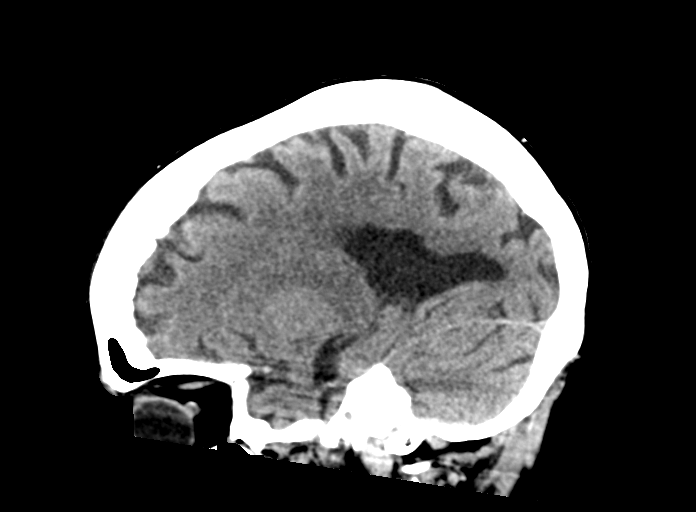

[15 of 47 positions shown; findings below may reference images not displayed]

FINDINGS: Brain: No acute territorial infarction, hemorrhage, or intracranial
mass is visualized. Stable ventricle size. Mild to moderate atrophy.

Vascular: No hyperdense vessels.  Carotid artery calcifications.

Skull: Normal. Negative for fracture or focal lesion.

Sinuses/Orbits: No acute finding.

Other: None
IMPRESSION: No CT evidence for acute intracranial abnormality.

## 2017-10-13 DIAGNOSIS — F419 Anxiety disorder, unspecified: Secondary | ICD-10-CM | POA: Diagnosis not present

## 2017-10-13 DIAGNOSIS — F0391 Unspecified dementia with behavioral disturbance: Secondary | ICD-10-CM | POA: Diagnosis not present

## 2017-10-28 DIAGNOSIS — F419 Anxiety disorder, unspecified: Secondary | ICD-10-CM | POA: Diagnosis not present

## 2017-10-28 DIAGNOSIS — F0391 Unspecified dementia with behavioral disturbance: Secondary | ICD-10-CM | POA: Diagnosis not present

## 2017-11-02 DIAGNOSIS — F0390 Unspecified dementia without behavioral disturbance: Secondary | ICD-10-CM | POA: Diagnosis not present

## 2017-11-02 DIAGNOSIS — Z79899 Other long term (current) drug therapy: Secondary | ICD-10-CM | POA: Diagnosis not present

## 2017-11-02 DIAGNOSIS — F29 Unspecified psychosis not due to a substance or known physiological condition: Secondary | ICD-10-CM | POA: Diagnosis not present

## 2017-11-02 DIAGNOSIS — R279 Unspecified lack of coordination: Secondary | ICD-10-CM | POA: Diagnosis not present

## 2017-11-02 DIAGNOSIS — I959 Hypotension, unspecified: Secondary | ICD-10-CM | POA: Diagnosis not present

## 2017-11-02 DIAGNOSIS — F0391 Unspecified dementia with behavioral disturbance: Secondary | ICD-10-CM | POA: Diagnosis not present

## 2017-11-02 DIAGNOSIS — R0689 Other abnormalities of breathing: Secondary | ICD-10-CM | POA: Diagnosis not present

## 2017-11-02 DIAGNOSIS — Z743 Need for continuous supervision: Secondary | ICD-10-CM | POA: Diagnosis not present

## 2017-11-02 DIAGNOSIS — R456 Violent behavior: Secondary | ICD-10-CM | POA: Diagnosis not present

## 2017-11-02 DIAGNOSIS — R404 Transient alteration of awareness: Secondary | ICD-10-CM | POA: Diagnosis not present

## 2017-11-09 DIAGNOSIS — F419 Anxiety disorder, unspecified: Secondary | ICD-10-CM | POA: Diagnosis not present

## 2017-11-09 DIAGNOSIS — G309 Alzheimer's disease, unspecified: Secondary | ICD-10-CM | POA: Diagnosis not present

## 2017-11-10 DIAGNOSIS — F0391 Unspecified dementia with behavioral disturbance: Secondary | ICD-10-CM | POA: Diagnosis not present

## 2017-11-10 DIAGNOSIS — F419 Anxiety disorder, unspecified: Secondary | ICD-10-CM | POA: Diagnosis not present

## 2017-11-28 DIAGNOSIS — F419 Anxiety disorder, unspecified: Secondary | ICD-10-CM | POA: Diagnosis not present

## 2017-11-28 DIAGNOSIS — F0391 Unspecified dementia with behavioral disturbance: Secondary | ICD-10-CM | POA: Diagnosis not present

## 2017-11-30 DIAGNOSIS — F0281 Dementia in other diseases classified elsewhere with behavioral disturbance: Secondary | ICD-10-CM | POA: Diagnosis not present

## 2017-11-30 DIAGNOSIS — G309 Alzheimer's disease, unspecified: Secondary | ICD-10-CM | POA: Diagnosis not present

## 2017-11-30 DIAGNOSIS — F419 Anxiety disorder, unspecified: Secondary | ICD-10-CM | POA: Diagnosis not present

## 2017-12-03 DIAGNOSIS — R52 Pain, unspecified: Secondary | ICD-10-CM | POA: Diagnosis not present

## 2017-12-03 DIAGNOSIS — J3089 Other allergic rhinitis: Secondary | ICD-10-CM | POA: Diagnosis not present

## 2017-12-03 DIAGNOSIS — R410 Disorientation, unspecified: Secondary | ICD-10-CM | POA: Diagnosis present

## 2017-12-03 DIAGNOSIS — K5909 Other constipation: Secondary | ICD-10-CM | POA: Diagnosis present

## 2017-12-03 DIAGNOSIS — G3189 Other specified degenerative diseases of nervous system: Secondary | ICD-10-CM | POA: Diagnosis not present

## 2017-12-03 DIAGNOSIS — E162 Hypoglycemia, unspecified: Secondary | ICD-10-CM | POA: Diagnosis not present

## 2017-12-03 DIAGNOSIS — B9620 Unspecified Escherichia coli [E. coli] as the cause of diseases classified elsewhere: Secondary | ICD-10-CM | POA: Diagnosis not present

## 2017-12-03 DIAGNOSIS — R54 Age-related physical debility: Secondary | ICD-10-CM | POA: Diagnosis not present

## 2017-12-03 DIAGNOSIS — G9340 Encephalopathy, unspecified: Secondary | ICD-10-CM | POA: Diagnosis not present

## 2017-12-03 DIAGNOSIS — G309 Alzheimer's disease, unspecified: Secondary | ICD-10-CM | POA: Diagnosis present

## 2017-12-03 DIAGNOSIS — R638 Other symptoms and signs concerning food and fluid intake: Secondary | ICD-10-CM | POA: Diagnosis not present

## 2017-12-03 DIAGNOSIS — R531 Weakness: Secondary | ICD-10-CM | POA: Diagnosis not present

## 2017-12-03 DIAGNOSIS — Z961 Presence of intraocular lens: Secondary | ICD-10-CM | POA: Diagnosis not present

## 2017-12-03 DIAGNOSIS — G9341 Metabolic encephalopathy: Secondary | ICD-10-CM | POA: Diagnosis present

## 2017-12-03 DIAGNOSIS — R451 Restlessness and agitation: Secondary | ICD-10-CM | POA: Diagnosis present

## 2017-12-03 DIAGNOSIS — E161 Other hypoglycemia: Secondary | ICD-10-CM | POA: Diagnosis not present

## 2017-12-03 DIAGNOSIS — Z8719 Personal history of other diseases of the digestive system: Secondary | ICD-10-CM | POA: Diagnosis not present

## 2017-12-03 DIAGNOSIS — F0281 Dementia in other diseases classified elsewhere with behavioral disturbance: Secondary | ICD-10-CM | POA: Diagnosis present

## 2017-12-03 DIAGNOSIS — N390 Urinary tract infection, site not specified: Secondary | ICD-10-CM | POA: Diagnosis not present

## 2017-12-03 DIAGNOSIS — G9389 Other specified disorders of brain: Secondary | ICD-10-CM | POA: Diagnosis not present

## 2017-12-03 DIAGNOSIS — K5900 Constipation, unspecified: Secondary | ICD-10-CM | POA: Diagnosis not present

## 2017-12-03 DIAGNOSIS — Z2821 Immunization not carried out because of patient refusal: Secondary | ICD-10-CM | POA: Diagnosis not present

## 2017-12-03 DIAGNOSIS — N183 Chronic kidney disease, stage 3 (moderate): Secondary | ICD-10-CM | POA: Diagnosis present

## 2017-12-03 DIAGNOSIS — N3090 Cystitis, unspecified without hematuria: Secondary | ICD-10-CM | POA: Diagnosis not present

## 2017-12-03 DIAGNOSIS — I4891 Unspecified atrial fibrillation: Secondary | ICD-10-CM | POA: Diagnosis not present

## 2017-12-03 DIAGNOSIS — F05 Delirium due to known physiological condition: Secondary | ICD-10-CM | POA: Diagnosis not present

## 2017-12-03 DIAGNOSIS — Z79899 Other long term (current) drug therapy: Secondary | ICD-10-CM | POA: Diagnosis not present

## 2017-12-03 DIAGNOSIS — Z8659 Personal history of other mental and behavioral disorders: Secondary | ICD-10-CM | POA: Diagnosis not present

## 2017-12-03 DIAGNOSIS — N189 Chronic kidney disease, unspecified: Secondary | ICD-10-CM | POA: Diagnosis not present

## 2017-12-03 DIAGNOSIS — N3000 Acute cystitis without hematuria: Secondary | ICD-10-CM | POA: Diagnosis present

## 2017-12-03 DIAGNOSIS — F0391 Unspecified dementia with behavioral disturbance: Secondary | ICD-10-CM | POA: Diagnosis not present

## 2017-12-03 DIAGNOSIS — R404 Transient alteration of awareness: Secondary | ICD-10-CM | POA: Diagnosis not present

## 2017-12-03 DIAGNOSIS — R4182 Altered mental status, unspecified: Secondary | ICD-10-CM | POA: Diagnosis not present

## 2017-12-12 DIAGNOSIS — K5900 Constipation, unspecified: Secondary | ICD-10-CM | POA: Diagnosis not present

## 2017-12-12 DIAGNOSIS — Z961 Presence of intraocular lens: Secondary | ICD-10-CM | POA: Diagnosis not present

## 2017-12-12 DIAGNOSIS — J3089 Other allergic rhinitis: Secondary | ICD-10-CM | POA: Diagnosis not present

## 2017-12-12 DIAGNOSIS — R638 Other symptoms and signs concerning food and fluid intake: Secondary | ICD-10-CM | POA: Diagnosis not present

## 2017-12-12 DIAGNOSIS — R54 Age-related physical debility: Secondary | ICD-10-CM | POA: Diagnosis not present

## 2017-12-12 DIAGNOSIS — N189 Chronic kidney disease, unspecified: Secondary | ICD-10-CM | POA: Diagnosis not present

## 2017-12-12 DIAGNOSIS — G309 Alzheimer's disease, unspecified: Secondary | ICD-10-CM | POA: Diagnosis not present

## 2017-12-12 DIAGNOSIS — G3189 Other specified degenerative diseases of nervous system: Secondary | ICD-10-CM | POA: Diagnosis not present

## 2017-12-12 DIAGNOSIS — F0391 Unspecified dementia with behavioral disturbance: Secondary | ICD-10-CM | POA: Diagnosis not present

## 2017-12-12 DIAGNOSIS — Z2821 Immunization not carried out because of patient refusal: Secondary | ICD-10-CM | POA: Diagnosis not present

## 2017-12-14 DIAGNOSIS — F0281 Dementia in other diseases classified elsewhere with behavioral disturbance: Secondary | ICD-10-CM | POA: Diagnosis not present

## 2017-12-14 DIAGNOSIS — G309 Alzheimer's disease, unspecified: Secondary | ICD-10-CM | POA: Diagnosis not present

## 2017-12-14 DIAGNOSIS — F419 Anxiety disorder, unspecified: Secondary | ICD-10-CM | POA: Diagnosis not present

## 2017-12-21 DIAGNOSIS — G309 Alzheimer's disease, unspecified: Secondary | ICD-10-CM | POA: Diagnosis not present

## 2017-12-21 DIAGNOSIS — F0281 Dementia in other diseases classified elsewhere with behavioral disturbance: Secondary | ICD-10-CM | POA: Diagnosis not present

## 2017-12-21 DIAGNOSIS — F419 Anxiety disorder, unspecified: Secondary | ICD-10-CM | POA: Diagnosis not present

## 2017-12-22 DIAGNOSIS — F0281 Dementia in other diseases classified elsewhere with behavioral disturbance: Secondary | ICD-10-CM | POA: Diagnosis not present

## 2017-12-22 DIAGNOSIS — G309 Alzheimer's disease, unspecified: Secondary | ICD-10-CM | POA: Diagnosis not present

## 2017-12-25 DIAGNOSIS — F0281 Dementia in other diseases classified elsewhere with behavioral disturbance: Secondary | ICD-10-CM | POA: Diagnosis not present

## 2017-12-25 DIAGNOSIS — G309 Alzheimer's disease, unspecified: Secondary | ICD-10-CM | POA: Diagnosis not present

## 2017-12-30 DIAGNOSIS — F0281 Dementia in other diseases classified elsewhere with behavioral disturbance: Secondary | ICD-10-CM | POA: Diagnosis not present

## 2017-12-30 DIAGNOSIS — G309 Alzheimer's disease, unspecified: Secondary | ICD-10-CM | POA: Diagnosis not present

## 2018-01-03 DIAGNOSIS — F0281 Dementia in other diseases classified elsewhere with behavioral disturbance: Secondary | ICD-10-CM | POA: Diagnosis not present

## 2018-01-03 DIAGNOSIS — G309 Alzheimer's disease, unspecified: Secondary | ICD-10-CM | POA: Diagnosis not present

## 2018-01-05 DIAGNOSIS — F0281 Dementia in other diseases classified elsewhere with behavioral disturbance: Secondary | ICD-10-CM | POA: Diagnosis not present

## 2018-01-05 DIAGNOSIS — G309 Alzheimer's disease, unspecified: Secondary | ICD-10-CM | POA: Diagnosis not present

## 2018-01-06 DIAGNOSIS — G309 Alzheimer's disease, unspecified: Secondary | ICD-10-CM | POA: Diagnosis not present

## 2018-01-06 DIAGNOSIS — F0281 Dementia in other diseases classified elsewhere with behavioral disturbance: Secondary | ICD-10-CM | POA: Diagnosis not present

## 2018-01-09 DIAGNOSIS — G309 Alzheimer's disease, unspecified: Secondary | ICD-10-CM | POA: Diagnosis not present

## 2018-01-09 DIAGNOSIS — F0281 Dementia in other diseases classified elsewhere with behavioral disturbance: Secondary | ICD-10-CM | POA: Diagnosis not present

## 2018-01-11 DIAGNOSIS — F0281 Dementia in other diseases classified elsewhere with behavioral disturbance: Secondary | ICD-10-CM | POA: Diagnosis not present

## 2018-01-11 DIAGNOSIS — G309 Alzheimer's disease, unspecified: Secondary | ICD-10-CM | POA: Diagnosis not present

## 2018-01-13 DIAGNOSIS — F0281 Dementia in other diseases classified elsewhere with behavioral disturbance: Secondary | ICD-10-CM | POA: Diagnosis not present

## 2018-01-13 DIAGNOSIS — G309 Alzheimer's disease, unspecified: Secondary | ICD-10-CM | POA: Diagnosis not present

## 2018-01-16 DIAGNOSIS — F0281 Dementia in other diseases classified elsewhere with behavioral disturbance: Secondary | ICD-10-CM | POA: Diagnosis not present

## 2018-01-16 DIAGNOSIS — G309 Alzheimer's disease, unspecified: Secondary | ICD-10-CM | POA: Diagnosis not present

## 2018-01-18 DIAGNOSIS — F0281 Dementia in other diseases classified elsewhere with behavioral disturbance: Secondary | ICD-10-CM | POA: Diagnosis not present

## 2018-01-18 DIAGNOSIS — G309 Alzheimer's disease, unspecified: Secondary | ICD-10-CM | POA: Diagnosis not present

## 2018-01-19 DIAGNOSIS — G309 Alzheimer's disease, unspecified: Secondary | ICD-10-CM | POA: Diagnosis not present

## 2018-01-19 DIAGNOSIS — F0281 Dementia in other diseases classified elsewhere with behavioral disturbance: Secondary | ICD-10-CM | POA: Diagnosis not present

## 2018-01-23 DIAGNOSIS — F0281 Dementia in other diseases classified elsewhere with behavioral disturbance: Secondary | ICD-10-CM | POA: Diagnosis not present

## 2018-01-23 DIAGNOSIS — G309 Alzheimer's disease, unspecified: Secondary | ICD-10-CM | POA: Diagnosis not present

## 2018-02-06 DIAGNOSIS — K5900 Constipation, unspecified: Secondary | ICD-10-CM | POA: Diagnosis not present

## 2018-02-06 DIAGNOSIS — F0391 Unspecified dementia with behavioral disturbance: Secondary | ICD-10-CM | POA: Diagnosis not present

## 2018-02-06 DIAGNOSIS — J3089 Other allergic rhinitis: Secondary | ICD-10-CM | POA: Diagnosis not present

## 2018-02-06 DIAGNOSIS — Z961 Presence of intraocular lens: Secondary | ICD-10-CM | POA: Diagnosis not present

## 2018-02-06 DIAGNOSIS — G3189 Other specified degenerative diseases of nervous system: Secondary | ICD-10-CM | POA: Diagnosis not present

## 2018-02-06 DIAGNOSIS — N189 Chronic kidney disease, unspecified: Secondary | ICD-10-CM | POA: Diagnosis not present

## 2018-02-06 DIAGNOSIS — R54 Age-related physical debility: Secondary | ICD-10-CM | POA: Diagnosis not present

## 2018-02-06 DIAGNOSIS — D649 Anemia, unspecified: Secondary | ICD-10-CM | POA: Diagnosis not present

## 2018-02-06 DIAGNOSIS — Z2821 Immunization not carried out because of patient refusal: Secondary | ICD-10-CM | POA: Diagnosis not present

## 2018-02-06 DIAGNOSIS — R638 Other symptoms and signs concerning food and fluid intake: Secondary | ICD-10-CM | POA: Diagnosis not present

## 2018-02-06 DIAGNOSIS — G309 Alzheimer's disease, unspecified: Secondary | ICD-10-CM | POA: Diagnosis not present

## 2018-02-08 DIAGNOSIS — F419 Anxiety disorder, unspecified: Secondary | ICD-10-CM | POA: Diagnosis not present

## 2018-02-08 DIAGNOSIS — G309 Alzheimer's disease, unspecified: Secondary | ICD-10-CM | POA: Diagnosis not present

## 2018-02-08 DIAGNOSIS — F0281 Dementia in other diseases classified elsewhere with behavioral disturbance: Secondary | ICD-10-CM | POA: Diagnosis not present

## 2018-02-13 DIAGNOSIS — F419 Anxiety disorder, unspecified: Secondary | ICD-10-CM | POA: Diagnosis not present

## 2018-02-13 DIAGNOSIS — F0391 Unspecified dementia with behavioral disturbance: Secondary | ICD-10-CM | POA: Diagnosis not present

## 2018-02-14 DIAGNOSIS — W19XXXA Unspecified fall, initial encounter: Secondary | ICD-10-CM | POA: Diagnosis not present

## 2018-02-14 DIAGNOSIS — R4182 Altered mental status, unspecified: Secondary | ICD-10-CM | POA: Diagnosis not present

## 2018-02-14 DIAGNOSIS — F0390 Unspecified dementia without behavioral disturbance: Secondary | ICD-10-CM | POA: Diagnosis not present

## 2018-02-14 DIAGNOSIS — R2689 Other abnormalities of gait and mobility: Secondary | ICD-10-CM | POA: Diagnosis not present

## 2018-02-14 DIAGNOSIS — S0101XA Laceration without foreign body of scalp, initial encounter: Secondary | ICD-10-CM | POA: Diagnosis not present

## 2018-02-14 DIAGNOSIS — R58 Hemorrhage, not elsewhere classified: Secondary | ICD-10-CM | POA: Diagnosis not present

## 2018-02-16 DIAGNOSIS — Z2821 Immunization not carried out because of patient refusal: Secondary | ICD-10-CM | POA: Diagnosis not present

## 2018-02-16 DIAGNOSIS — R54 Age-related physical debility: Secondary | ICD-10-CM | POA: Diagnosis not present

## 2018-02-16 DIAGNOSIS — S0101XA Laceration without foreign body of scalp, initial encounter: Secondary | ICD-10-CM | POA: Diagnosis not present

## 2018-02-16 DIAGNOSIS — N189 Chronic kidney disease, unspecified: Secondary | ICD-10-CM | POA: Diagnosis not present

## 2018-02-16 DIAGNOSIS — K5900 Constipation, unspecified: Secondary | ICD-10-CM | POA: Diagnosis not present

## 2018-02-16 DIAGNOSIS — J3089 Other allergic rhinitis: Secondary | ICD-10-CM | POA: Diagnosis not present

## 2018-02-16 DIAGNOSIS — Z961 Presence of intraocular lens: Secondary | ICD-10-CM | POA: Diagnosis not present

## 2018-02-16 DIAGNOSIS — F0391 Unspecified dementia with behavioral disturbance: Secondary | ICD-10-CM | POA: Diagnosis not present

## 2018-02-16 DIAGNOSIS — W19XXXA Unspecified fall, initial encounter: Secondary | ICD-10-CM | POA: Diagnosis not present

## 2018-02-16 DIAGNOSIS — D649 Anemia, unspecified: Secondary | ICD-10-CM | POA: Diagnosis not present

## 2018-02-16 DIAGNOSIS — G309 Alzheimer's disease, unspecified: Secondary | ICD-10-CM | POA: Diagnosis not present

## 2018-02-16 DIAGNOSIS — R638 Other symptoms and signs concerning food and fluid intake: Secondary | ICD-10-CM | POA: Diagnosis not present

## 2018-03-29 DIAGNOSIS — G309 Alzheimer's disease, unspecified: Secondary | ICD-10-CM | POA: Diagnosis not present

## 2018-03-29 DIAGNOSIS — F0281 Dementia in other diseases classified elsewhere with behavioral disturbance: Secondary | ICD-10-CM | POA: Diagnosis not present

## 2018-03-29 DIAGNOSIS — F419 Anxiety disorder, unspecified: Secondary | ICD-10-CM | POA: Diagnosis not present

## 2018-09-25 DEATH — deceased
